# Patient Record
Sex: Male | Born: 1965 | Race: White | Hispanic: No | Marital: Single | Smoking: Never smoker
Health system: Southern US, Community
[De-identification: ages and names within clinical notes are randomized; demographics above are authoritative.]

## PROBLEM LIST (undated history)

## (undated) DIAGNOSIS — D849 Immunodeficiency, unspecified: Secondary | ICD-10-CM

## (undated) DIAGNOSIS — Z21 Asymptomatic human immunodeficiency virus [HIV] infection status: Secondary | ICD-10-CM

## (undated) DIAGNOSIS — J45909 Unspecified asthma, uncomplicated: Secondary | ICD-10-CM

## (undated) DIAGNOSIS — A529 Late syphilis, unspecified: Secondary | ICD-10-CM

## (undated) HISTORY — PX: HERNIA REPAIR: SHX51

## (undated) HISTORY — PX: DENTAL SURGERY: SHX609

---

## 2007-07-06 ENCOUNTER — Encounter: Payer: Self-pay | Admitting: Internal Medicine

## 2009-04-03 ENCOUNTER — Ambulatory Visit: Payer: Self-pay | Admitting: Internal Medicine

## 2009-04-03 DIAGNOSIS — B20 Human immunodeficiency virus [HIV] disease: Secondary | ICD-10-CM

## 2009-04-03 LAB — CONVERTED CEMR LAB
ALT: 21 units/L (ref 0–53)
Basophils Absolute: 0 10*3/uL (ref 0.0–0.1)
Basophils Relative: 1 % (ref 0–1)
CO2: 26 meq/L (ref 19–32)
Calcium: 9.2 mg/dL (ref 8.4–10.5)
Chlamydia, Swab/Urine, PCR: NEGATIVE
Chloride: 101 meq/L (ref 96–112)
Cholesterol: 198 mg/dL (ref 0–200)
Creatinine, Ser: 1.11 mg/dL (ref 0.40–1.50)
Glucose, Bld: 92 mg/dL (ref 70–99)
HCV Ab: NEGATIVE
HIV-1 RNA Quant, Log: <1.68 (ref <1.68)
HIV: REACTIVE
Hemoglobin, Urine: NEGATIVE
Hep B Core Total Ab: POSITIVE — AB
Hep B S Ab: POSITIVE — AB
Leukocytes, UA: NEGATIVE
MCHC: 35.2 g/dL (ref 30.0–36.0)
Neutro Abs: 3.3 10*3/uL (ref 1.7–7.7)
Neutrophils Relative %: 71 % (ref 43–77)
Platelets: 233 10*3/uL (ref 150–400)
RDW: 13.1 % (ref 11.5–15.5)
Sodium: 138 meq/L (ref 135–145)
Total Protein: 7.1 g/dL (ref 6.0–8.3)
Urine Glucose: NEGATIVE mg/dL
VLDL: 28 mg/dL (ref 0–40)
pH: 6.5 (ref 5.0–8.0)

## 2009-04-22 ENCOUNTER — Ambulatory Visit: Payer: Self-pay | Admitting: Internal Medicine

## 2009-04-22 ENCOUNTER — Encounter (INDEPENDENT_AMBULATORY_CARE_PROVIDER_SITE_OTHER): Payer: Self-pay | Admitting: *Deleted

## 2009-07-21 ENCOUNTER — Telehealth (INDEPENDENT_AMBULATORY_CARE_PROVIDER_SITE_OTHER): Payer: Self-pay | Admitting: *Deleted

## 2009-07-21 ENCOUNTER — Ambulatory Visit: Payer: Self-pay | Admitting: Internal Medicine

## 2009-07-21 LAB — CONVERTED CEMR LAB
ALT: 16 units/L (ref 0–53)
Albumin: 4.5 g/dL (ref 3.5–5.2)
CO2: 29 meq/L (ref 19–32)
Calcium: 9.2 mg/dL (ref 8.4–10.5)
Chloride: 104 meq/L (ref 96–112)
Creatinine, Ser: 0.85 mg/dL (ref 0.40–1.50)
HCT: 45.7 % (ref 39.0–52.0)
HIV 1 RNA Quant: <48 copies/mL (ref <48)
HIV-1 RNA Quant, Log: <1.68 (ref <1.68)
Lymphocytes Relative: 23 % (ref 12–46)
Lymphs Abs: 1 10*3/uL (ref 0.7–4.0)
Neutro Abs: 2.8 10*3/uL (ref 1.7–7.7)
Neutrophils Relative %: 66 % (ref 43–77)
Platelets: 208 10*3/uL (ref 150–400)
Total Protein: 6.9 g/dL (ref 6.0–8.3)
WBC: 4.3 10*3/uL (ref 4.0–10.5)

## 2009-07-25 ENCOUNTER — Encounter: Payer: Self-pay | Admitting: Internal Medicine

## 2009-08-06 ENCOUNTER — Ambulatory Visit: Payer: Self-pay | Admitting: Internal Medicine

## 2009-08-08 ENCOUNTER — Telehealth: Payer: Self-pay | Admitting: Internal Medicine

## 2009-09-26 ENCOUNTER — Telehealth (INDEPENDENT_AMBULATORY_CARE_PROVIDER_SITE_OTHER): Payer: Self-pay | Admitting: *Deleted

## 2009-11-05 ENCOUNTER — Emergency Department (HOSPITAL_COMMUNITY): Admission: EM | Admit: 2009-11-05 | Discharge: 2009-11-05 | Payer: Self-pay | Admitting: Emergency Medicine

## 2009-11-05 ENCOUNTER — Ambulatory Visit: Payer: Self-pay | Admitting: Internal Medicine

## 2009-11-05 LAB — CONVERTED CEMR LAB
ALT: 18 units/L (ref 0–53)
AST: 18 units/L (ref 0–37)
Alkaline Phosphatase: 86 units/L (ref 39–117)
Basophils Absolute: 0 10*3/uL (ref 0.0–0.1)
Calcium: 9.5 mg/dL (ref 8.4–10.5)
Chloride: 101 meq/L (ref 96–112)
Creatinine, Ser: 1.06 mg/dL (ref 0.40–1.50)
Eosinophils Absolute: 0.1 10*3/uL (ref 0.0–0.7)
Lymphocytes Relative: 12 % (ref 12–46)
Lymphs Abs: 0.7 10*3/uL (ref 0.7–4.0)
Neutrophils Relative %: 79 % — ABNORMAL HIGH (ref 43–77)
Platelets: 193 10*3/uL (ref 150–400)
Potassium: 4 meq/L (ref 3.5–5.3)
WBC: 5.9 10*3/uL (ref 4.0–10.5)

## 2009-11-19 ENCOUNTER — Ambulatory Visit: Payer: Self-pay | Admitting: Internal Medicine

## 2009-11-19 ENCOUNTER — Telehealth (INDEPENDENT_AMBULATORY_CARE_PROVIDER_SITE_OTHER): Payer: Self-pay | Admitting: *Deleted

## 2010-02-23 ENCOUNTER — Ambulatory Visit: Payer: Self-pay | Admitting: Internal Medicine

## 2010-02-23 LAB — CONVERTED CEMR LAB
BUN: 12 mg/dL (ref 6–23)
Basophils Relative: 1 % (ref 0–1)
CO2: 25 meq/L (ref 19–32)
Calcium: 9.2 mg/dL (ref 8.4–10.5)
Chloride: 101 meq/L (ref 96–112)
Creatinine, Ser: 1 mg/dL (ref 0.40–1.50)
Eosinophils Absolute: 0 10*3/uL (ref 0.0–0.7)
Eosinophils Relative: 0 % (ref 0–5)
Glucose, Bld: 93 mg/dL (ref 70–99)
HCT: 40.6 % (ref 39.0–52.0)
HIV 1 RNA Quant: <48 copies/mL (ref <48)
HIV-1 RNA Quant, Log: <1.68 (ref <1.68)
Lymphs Abs: 0.7 10*3/uL (ref 0.7–4.0)
MCHC: 33 g/dL (ref 30.0–36.0)
MCV: 88.5 fL (ref 78.0–100.0)
Monocytes Absolute: 0.4 10*3/uL (ref 0.1–1.0)
Monocytes Relative: 10 % (ref 3–12)
Neutrophils Relative %: 74 % (ref 43–77)
RBC: 4.59 M/uL (ref 4.22–5.81)
Total Bilirubin: 0.5 mg/dL (ref 0.3–1.2)

## 2010-03-09 ENCOUNTER — Telehealth: Payer: Self-pay | Admitting: Internal Medicine

## 2010-04-10 ENCOUNTER — Telehealth: Payer: Self-pay | Admitting: Internal Medicine

## 2010-04-24 ENCOUNTER — Telehealth: Payer: Self-pay | Admitting: Internal Medicine

## 2010-05-06 ENCOUNTER — Encounter (INDEPENDENT_AMBULATORY_CARE_PROVIDER_SITE_OTHER): Payer: Self-pay | Admitting: *Deleted

## 2010-05-14 ENCOUNTER — Telehealth: Payer: Self-pay | Admitting: Infectious Disease

## 2010-05-18 ENCOUNTER — Encounter: Payer: Self-pay | Admitting: Internal Medicine

## 2010-05-18 ENCOUNTER — Ambulatory Visit: Payer: Self-pay | Admitting: Infectious Disease

## 2010-05-18 DIAGNOSIS — A523 Neurosyphilis, unspecified: Secondary | ICD-10-CM

## 2010-05-18 DIAGNOSIS — I82409 Acute embolism and thrombosis of unspecified deep veins of unspecified lower extremity: Secondary | ICD-10-CM | POA: Insufficient documentation

## 2010-05-18 LAB — CONVERTED CEMR LAB
HIV 1 RNA Quant: <48 copies/mL (ref <48)
INR: 2.33 — ABNORMAL HIGH (ref <1.50)
Prothrombin Time: 25.7 s — ABNORMAL HIGH (ref 11.6–15.2)

## 2010-05-19 ENCOUNTER — Telehealth (INDEPENDENT_AMBULATORY_CARE_PROVIDER_SITE_OTHER): Payer: Self-pay | Admitting: *Deleted

## 2010-05-19 ENCOUNTER — Telehealth: Payer: Self-pay | Admitting: Infectious Disease

## 2010-09-01 ENCOUNTER — Encounter (INDEPENDENT_AMBULATORY_CARE_PROVIDER_SITE_OTHER): Payer: Self-pay | Admitting: *Deleted

## 2010-10-20 NOTE — Progress Notes (Signed)
Summary: Received income info--pt needs visit in [facility]  Phone Note From Other Clinic   Caller: [facility] ID clinic Summary of Call: Rodell Perna called to say that patient is being discharged from their hospital.  He said patient finally faxed in proof of income that I had told him ADAP needed in which was the 2010 tax returns.  Rosanne Ashing is going to fax them to [facility] so we can submit for patient.  I told Rosanne Ashing that patient will still need to come to [facility] to complete the ADAP application since it has been a few months and ADAP never got the correct info.  He said that was the same thing he was just told by Quay Burow in the absence of Nobie Putnam who is on out today.  Patient needs to have an appointment in order to obtain refills. Initial call taken by: Paulo Fruit  BS,CPht II,MPH,  April 24, 2010 2:09 PM     Appended Document: Received income info--pt needs visit in [facility] patient came in today to complete paperwork for ncadap and RW program.  His application will be resent with the appropriate information ADAP has requested.

## 2010-10-20 NOTE — Assessment & Plan Note (Signed)
Summary: F/U/VS   CC:  3 month follow up.  History of Present Illness: Pt was sick with a URI about 2 weeks ago. He feels better now. He has not missed any doses of his meds.  Preventive Screening-Counseling & Management  Alcohol-Tobacco     Alcohol drinks/day: 0     Smoking Status: never  Caffeine-Diet-Exercise     Caffeine use/day: tea, some soda     Type of exercise: active at work     Exercise (avg: min/session): >60     Times/week: 5  Safety-Violence-Falls     Seat Belt Use: yes   Updated Prior Medication List: ATRIPLA 600-200-300 MG TABS (EFAVIRENZ-EMTRICITAB-TENOFOVIR) Take 1 tablet by mouth at bedtime BACTRIM DS 800-160 MG TABS (SULFAMETHOXAZOLE-TRIMETHOPRIM) Take 1 tablet by mouth once a day  Current Allergies (reviewed today): ! PCN Review of Systems  The patient denies anorexia, fever, and weight loss.    Vital Signs:  Patient profile:   45 year old male Height:      70 inches (177.80 cm) Weight:      157.8 pounds (71.73 kg) BMI:     22.72 Temp:     97.7 degrees F (36.50 degrees C) oral Pulse rate:   69 / minute BP sitting:   132 / 71  (left arm)  Vitals Entered By: Baxter Hire) (November 19, 2009 10:08 AM) CC: 3 month follow up Pain Assessment Patient in pain? no      Nutritional Status BMI of 19 -24 = normal Nutritional Status Detail appetite is good per patient  Have you ever been in a relationship where you felt threatened, hurt or afraid?No   Does patient need assistance? Functional Status Self care Ambulation Normal   Physical Exam  General:  alert, well-developed, well-nourished, and well-hydrated.   Head:  normocephalic and atraumatic.   Mouth:  pharynx pink and moist.   Lungs:  normal breath sounds.      Impression & Recommendations:  Problem # 1:  HIV INFECTION (ICD-042) Pt.s most recent CD4ct was150  and VL <48 .  Pt instructed to continue the current antiretroviral regimen.  Pt encouraged to take medication  regularly and not miss doses.  Pt will f/u in 3 months for repeat blood work and will see me 2 weeks later.  Diagnostics Reviewed:  HIV: CDC-defined AIDS (04/22/2009)   HIV-Western blot: Positive (04/03/2009)   CD4: 150 (11/06/2009)   WBC: 5.9 (11/05/2009)   Hgb: 15.7 (11/05/2009)   HCT: 45.6 (11/05/2009)   Platelets: 193 (11/05/2009) HIV-1 RNA: <48 copies/mL (11/05/2009)   HBSAg: NEG (04/03/2009)  Orders: Est. Patient Level III (99213)Future Orders: T-CD4SP ([facility]) (CD4SP) ... 02/17/2010 T-HIV Viral Load [phone]) ... 02/17/2010 T-Comprehensive Metabolic Panel [phone]) ... 02/17/2010 T-CBC w/Diff ([id number]) ... 02/17/2010  Patient Instructions: 1)  Please schedule a follow-up appointment in 3 months, 2 weeks after labs.  Prescriptions: BACTRIM DS 800-160 MG TABS (SULFAMETHOXAZOLE-TRIMETHOPRIM) Take 1 tablet by mouth once a day  #30 x 2   Entered and Authorized by:   Yisroel Ramming MD   Signed by:   Yisroel Ramming MD on 11/19/2009   Method used:   Print then Give to Patient   RxID:   [id number]  Process Orders Check Orders Results:     [facility]: ABN not required for this insurance Tests Sent for requisitioning (November 19, 2009 10:23 AM):     02/17/2010: [facility] -- T-HIV Viral Load [phone] (signed)     02/17/2010:  [facility] -- T-Comprehensive Metabolic Panel [[id number]] (signed)     02/17/2010: [facility] -- T-CBC w/Diff [phone] (signed)

## 2010-10-20 NOTE — Progress Notes (Signed)
Summary: pt transfering care to [facility]  Phone Note Call from Patient   Caller: Patient Summary of Call: Patient called to say that he is transfering his care to [facility] and that he also received a letter in the mail in reference to his ADAP applciation that it is still pending. They are asking now for current updated income.  He said not to worry about resending at this time, for he will no longer be receiving care here in [place]. Initial call taken by: Paulo Fruit  BS,CPht II,MPH,  May 19, 2010 10:04 AM

## 2010-10-20 NOTE — Progress Notes (Signed)
Summary: Medication?  Phone Note From Other Clinic   Caller: [facility] Summary of Call: Trying to find out why patient does not have medication.  Jum Ausberger has already spoken to Nobie Putnam at [employer].  [employer] is still waiting on patient's 2010 tax return in which he has yet to bring.  I have spoken to Kaveh on 7 occassions and patient has faxed check stubbs, but ADAP wants addt'l information i e 2010 form.  Scot Dock was also told this by Nobie Putnam.  The reason why he called is that he wanted to know where patient was getting his medication because his VL is undectable, but patient has not had medication since March when the last sample was given to him.  Rosanne Ashing has said paitient is being treated at [facility] for Syphillis.  He is going to try and obtain the information patient never brought in order to see if he can start receiving medication.  If also asked who was following patient.  I said Kerby Borner.  Rosanne Ashing said if he needs anything he will call back. Initial call taken by: Paulo Fruit  BS,CPht II,MPH,  April 10, 2010 2:54 PM

## 2010-10-20 NOTE — Progress Notes (Signed)
Summary: lab results faxed  Phone Note Call from Patient   Caller: Patient Reason for Call: Lab or Test Results Summary of Call: Pt. called requesting INR results faxed to a different fax # at [facility] [phone].  Also wanted to thank Dr. Daiva Eves for all his help. Results faxed. Initial call taken by: Wendall Mola CMA [facility]),  May 19, 2010 10:30 AM

## 2010-10-20 NOTE — Miscellaneous (Signed)
Summary: clinical update/ryan white  Clinical Lists Changes  Observations: Added new observation of PCTFPL: 120.04  (05/06/2010 15:55) Added new observation of HOUSEINCOME: 09811  (05/06/2010 15:55) Added new observation of FINASSESSDT: 05/04/2010  (05/06/2010 15:55) Added new observation of RW VITAL STA: Active  (05/06/2010 15:55)

## 2010-10-20 NOTE — Progress Notes (Signed)
Summary: [employer] form complete/refills   Phone Note Call from Patient   Caller: patient appt w [facility] Summary of Call: Patient requesting a sample of Atripla. He never got it filled. He told me today he was unable to pay for medication in December witht he use of the copay card.  Recently, patient informed me that he got rid of his medical/drug coverage so he can get help. Asking for a prescription for Bactrim be sent to a pharmacy until he can get on a program that will supply this medication. Also asking for an Atripla sample. Initial call taken by: Paulo Fruit  BS,CPht II,MPH,  September 26, 2009 11:10 AM    Prescriptions: BACTRIM DS 800-160 MG TABS (SULFAMETHOXAZOLE-TRIMETHOPRIM) Take 1 tablet by mouth once a day  #30 x 2   Entered by:   Paulo Fruit  BS,CPht II,MPH   Authorized by:   Yisroel Ramming MD   Signed by:   Paulo Fruit  BS,CPht II,MPH on 09/26/2009   Method used:   Print then Give to Patient   RxID:   [id number] ATRIPLA 600-200-300 MG TABS (EFAVIRENZ-EMTRICITAB-TENOFOVIR) Take 1 tablet by mouth at bedtime  #30 x 0   Entered by:   Paulo Fruit  BS,CPht II,MPH   Authorized by:   Yisroel Ramming MD   Signed by:   Paulo Fruit  BS,CPht II,MPH on 09/26/2009   Method used:   Samples Given   RxID:   [id number]  Paulo Fruit  BS,CPht II,MPH  September 26, 2009 11:12 AM  Appended Document: [employer] form complete/refills  Sample Given, Lot #: [id number] [id number] Expiration Date:03 2012 Patient has been instructed regarding the correct time, dose and frequency of taking this med, including desired effects and most common side effects.

## 2010-10-20 NOTE — Progress Notes (Signed)
Summary: Sample given. last time  Phone Note Refill Request      Prescriptions: ATRIPLA 600-200-300 MG TABS (EFAVIRENZ-EMTRICITAB-TENOFOVIR) Take 1 tablet by mouth at bedtime  #30 x 0   Entered by:   Paulo Fruit  BS,CPht II,MPH   Authorized by:   Yisroel Ramming MD   Signed by:   Paulo Fruit  BS,CPht II,MPH on 11/19/2009   Method used:   Samples Given   RxID:   [id number]  **we have yet to here from [employer] whether patient has been approved.  they keep requesting additonal income information.  I have told patient once again to bring in 2010 W2s so I can resend information for 3rd time to [place].  Patient out of med and asked for sample.  Sample Given, Lot #: [id number] [id number] Expiration Date:02 2012 Patient has been instructed regarding the correct time, dose and frequency of taking this med, including desired effects and most common side effects. Paulo Fruit  BS,CPht II,MPH  November 19, 2009 10:58 AM

## 2010-10-20 NOTE — Progress Notes (Signed)
Summary: lab results  Phone Note Call from Patient   Caller: Patient Call For: Eddie Ramming MD Reason for Call: Talk to Nurse Details for Reason: pt. called for lab results Summary of Call: Pt. had appt. scheduled with Dr. Philipp Deputy for Wednesday 03/11/10 and work schedule changed today.  Requested lab results.  After verifying pts. identity with birthdate and social, gave results.  Pt. to call in three months to schedule lab and doctor appts. Initial call taken by: Wendall Mola CMA [facility]),  March 09, 2010 3:26 PM

## 2010-10-20 NOTE — Progress Notes (Signed)
Summary: Pt seen at 2 HIV clinics.  Phone Note From Other Clinic   Caller: Nurse Details for Reason: jim Ausperger Summary of Call: [facility] medical called and said that patient has an appt on Monday 05/18/10 in their ID clinic to follow up on HIV care and neruosurgery from his Syphilis. They just wanted [place] to know.  Patient is still pending approval for ADAP here in [facility]. Initial call taken by: Paulo Fruit  BS,CPht II,MPH,  May 14, 2010 2:39 PM

## 2010-10-20 NOTE — Assessment & Plan Note (Signed)
Summary: F/U/OV/DR Bea Laura PT/PER TAMMY/VS   CC:  f/u.  History of Present Illness: Pt stil having significant trouble obtaining meds; ADAP currently pending.   He states he submitted all needed paperwork for ADAP/Ryan White earlier this month.  He states has three pills of Atripla left; apparently he received his last rx from a friend's doctor in [place].  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  He reports f/u LP planned for January 2011. 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  Coumadinis  being managed by Dr. Hollace Kinnier at [facility]; last INR checked 8/15, at that time dose changed from 10mg  once daily with 15mg  on Monday to 15mg  MWF.  He is doing well on pred-forte and scopalamine per optho and will f/u with them on 06/11/10.   He wishes to transfer care to [facility].  He had an appt with them this morning before his appt at [facility].  His next appt is scheduled for 06/16/10 with Emelda Brothers, NP.  He has no other concerns/complaints. Dr. Azucena Freed [phone] Emelda Brothers, NP (ID) [phone] (ph), [phone] (office), [phone] (fax)   Updated Prior Medication List: ATRIPLA 600-200-300 MG TABS (EFAVIRENZ-EMTRICITAB-TENOFOVIR) Take 1 tablet by mouth at bedtime WARFARIN SODIUM 10 MG TABS (WARFARIN SODIUM) TUES. Thurs, SAT and Sun 10 mg Mon. Wed. Fri 15 mg  Current Allergies (reviewed today): ! PCN Past History:  Past medical, surgical, family and social histories (including risk factors) reviewed for relevance to current acute and chronic problems.  Family History: Reviewed history from 04/22/2009 and no changes required. Family History Diabetes 1st degree relative  Social History: Reviewed history and no changes required.  Vital Signs:  Patient profile:   45 year old male Height:      70 inches (177.80  cm) Weight:      147.75 pounds (67.16 kg) BMI:     21.28 Temp:     97.6 degrees F (36.44 degrees C) oral Pulse rate:   61 / minute BP sitting:   127 / 79  (left arm)  Vitals Entered By: Starleen Arms CMA (May 18, 2010 11:23 AM) CC: f/u Is Patient Diabetic? No Pain Assessment Patient in pain? no      Nutritional Status BMI of 19 -24 = normal  Does patient need assistance? Functional Status Self care Ambulation Normal   Physical Exam  General:  alert, well-developed, well-nourished, and well-hydrated.   Head:  normocephalic and atraumatic.   Eyes:  vision grossly intact.  + Argyll-Robertson pupil present in left eye; accomodates, non-reactive.  Right pupil wnl and reactive.  Anicteric.  No conjunctival pallor or injection. Mouth:  pharynx pink and moist.   Neck:  no cervical lymphadenopathy.   Lungs:  normal breath sounds.   Heart:  normal rate and regular rhythm.   Extremities:  No edema Neurologic:  alert & oriented X3, cranial nerves II-XII intact, strength normal in all extremities, sensation intact to light touch, and gait normal.  alert & oriented X3, cranial nerves II-XII intact, strength normal in all extremities, sensation intact to light touch, and gait normal.   Skin:  turgor normal and color normal.  turgor normal and color normal.   Psych:  Oriented X3, memory intact for recent and remote, normally interactive, good eye contact, not anxious appearing, and not depressed appearing.  Oriented X3, memory intact for recent and remote,  normally interactive, good eye contact, not anxious appearing, and not depressed appearing.       Impression & Recommendations:  Problem # 1:  HIV INFECTION (ICD-042)  Pt will transition care to [facility] ID.  He cannot recall when his last CD4 and VL were checked.  WIll check CD4 and VL  today and fax results to Emelda Brothers with ID.  Pt reports his last CD4 was > 200, however as we have no evidence of a recent CD4 > 200, it is  prudent to continue PCP ppx.  Will d/c bactrim to avoid advere interactions with coumadin.  Will check G6PD assay and begin PCP ppx with dapsone pending G6PD results.    Reviewed notes re: ADAP and Juanell Fairly.  It appears as though the application was sent with all necessary paperwork.  No samples are available to provide pt with supply of Atripla today.  Will have him speak with Paulo Fruit re: ADAP, Juanell Fairly, Atripla rx, etc.  The following medications were removed from the medication list:    Bactrim Ds 800-160 Mg Tabs (Sulfamethoxazole-trimethoprim) .Marland Kitchen... Take 1 tablet by mouth once a day  Orders: T-HIV Viral Load [id number]) T-CD4 [phone]) T-Assay of G6PD Enzyme ([id number]) Est. 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  Orders: Est. 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 Janee Morn and pts new ID clinic. Orders: T-Protime, Auto ([id number]) Est. Patient Level IV (43329)  Medications Added to Medication List This Visit: 1)  Warfarin Sodium 10 Mg Tabs (Warfarin sodium) .... Tues. thurs, sat and sun 10 mg mon. wed. fri 15 mg Prescriptions: ATRIPLA 600-200-300 MG TABS (EFAVIRENZ-EMTRICITAB-TENOFOVIR) Take 1 tablet by mouth at bedtime  #30 x 11   Entered and Authorized by:   Acey Lav MD   Signed by:   Nelda Bucks DO on 05/18/2010   Method used:   Print then Give to Patient   RxID:   [id number]   Appended Document: F/U/OV/DR Bea Laura PT/PER TAMMY/VS   Immunizations  Administered:  Influenza Vaccine # 1:    Vaccine Type: Fluvax 3+    Site: left deltoid    Mfr: novartis    Dose: 0.5 ml    Route: IM    Given by: Starleen Arms CMA    Exp. Date: 12/19/2009    Lot #: [id number]    VIS given: 04/13/07 version given May 18, 2010.  Flu Vaccine Consent Questions:    Do you have a history of severe allergic reactions to this vaccine? no    Any prior history of allergic reactions to egg and/or gelatin? no    Do you have a sensitivity to the preservative Thimersol? no    Do you have a past history of Guillan-Barre Syndrome? no    Do you currently have an acute febrile illness? no    Have you ever had a severe reaction to latex? no    Vaccine information given and explained to patient? yes   Appended Document: F/U/OV/DR Bea Laura PT/PER TAMMY/VS Pt was  seen and examined with the resident. I agree with the assessment and plan as outlined in her note. Pt is transitioning care to [facility] where he was dx with ocular syphilis and is sp 2 wks of IV PCN. I have written atripla rx for him, though he had trouble with his paperwork for adap. I will check INR and fax to his PCP

## 2010-10-22 NOTE — Miscellaneous (Signed)
  Clinical Lists Changes  Observations: Added new observation of YEARAIDSPOS: 2010  (09/01/2010 16:38)

## 2010-12-04 LAB — T-HELPER CELL (CD4) - (RCID CLINIC ONLY)
CD4 % Helper T Cell: 24 % — ABNORMAL LOW (ref 33–55)
CD4 T Cell Abs: 270 uL — ABNORMAL LOW (ref 400–2700)

## 2010-12-27 LAB — T-HELPER CELL (CD4) - (RCID CLINIC ONLY)
CD4 % Helper T Cell: 22 % — ABNORMAL LOW (ref 33–55)
CD4 T Cell Abs: 230 uL — ABNORMAL LOW (ref 400–2700)

## 2011-04-26 ENCOUNTER — Telehealth: Payer: Self-pay | Admitting: *Deleted

## 2011-04-26 NOTE — Telephone Encounter (Signed)
Called patient and left message for him to call and schedule lab and f/u office visit. Wendall Mola CMA

## 2015-12-20 ENCOUNTER — Emergency Department
Admission: EM | Admit: 2015-12-20 | Discharge: 2015-12-20 | Disposition: A | Discharge status: Home / Self Care | Payer: Self-pay | Attending: Emergency Medicine | Admitting: Emergency Medicine

## 2015-12-20 ENCOUNTER — Encounter: Payer: Self-pay | Admitting: Emergency Medicine

## 2015-12-20 DIAGNOSIS — B349 Viral infection, unspecified: Secondary | ICD-10-CM | POA: Insufficient documentation

## 2015-12-20 HISTORY — DX: Immunodeficiency, unspecified: D84.9

## 2015-12-20 MED ORDER — ONDANSETRON HCL 4 MG PO TABS: 4.0000 mg | ORAL_TABLET | Freq: Three times a day (TID) | ORAL | Status: DC | PRN

## 2015-12-20 MED ORDER — GUAIFENESIN-CODEINE 100-10 MG/5ML PO SYRP: 5.0000 mL | ORAL_SOLUTION | Freq: Three times a day (TID) | ORAL | Status: DC | PRN

## 2015-12-20 NOTE — ED Provider Notes (Signed)
CSN: [id number]     Arrival date & time 12/20/15  1921 History   First MD Initiated Contact with Patient 12/20/15 2027     Chief Complaint  Patient presents with  . Cough    fever, cough, bodyaches     HPI   50 year old male who presents to the emergency department for evaluation of cough, cold, body aches, sore throat, and fever. Symptoms started 3 days ago. He has not been taking any type of over-the-counter medications for his symptoms. He states that tonight he is unable to go to work and will need a work excuse.  Past Medical History  Diagnosis Date  . Immune deficiency disorder [facility](HCC)    Past Surgical History  Procedure Laterality Date  . Hernia repair     History reviewed. No pertinent family history. Social History  Substance Use Topics  . Smoking status: Never Smoker   . Smokeless tobacco: None  . Alcohol Use: No    Review of Systems  Constitutional: Positive for fever, chills, activity change, appetite change and fatigue.  HENT: Positive for rhinorrhea, sinus pressure, sneezing and sore throat. Negative for ear pain, mouth sores and trouble swallowing.   Respiratory: Positive for cough. Negative for shortness of breath and wheezing.   Cardiovascular: Negative for chest pain.  Gastrointestinal: Negative for vomiting and diarrhea.  Genitourinary: Negative for decreased urine volume.  Musculoskeletal: Positive for myalgias.  Skin: Negative for rash.  Neurological: Positive for headaches. Negative for weakness.      Allergies  Penicillins  Home Medications   Prior to Admission medications   Medication Sig Start Date End Date Taking? Authorizing Provider  guaiFENesin-codeine (ROBITUSSIN AC) 100-10 MG/5ML syrup Take 5 mLs by mouth 3 (three) times daily as needed for cough. 12/20/15   Chinita Pesterari B Gustabo Gordillo, FNP  ondansetron (ZOFRAN) 4 MG tablet Take 1 tablet (4 mg total) by mouth every 8 (eight) hours as needed for nausea or vomiting. 12/20/15   Anmol Fleck B Lakysha Kossman, FNP   BP  121/73 mmHg  Pulse 96  Temp(Src) 99.5 F (37.5 C)  Resp 18  Ht 5\' 10"  (1.778 m)  Wt 63.504 kg  BMI 20.09 kg/m2  SpO2 97% Physical Exam  Constitutional: He is oriented to person, place, and time. He appears well-developed.  HENT:  Head: Normocephalic.  Right Ear: External ear normal.  Left Ear: External ear normal.  Mouth/Throat: Oropharynx is clear and moist. No oropharyngeal exudate.  Eyes: Conjunctivae are normal.  Neck: Normal range of motion. Neck supple.  Cardiovascular: Normal rate.   Pulmonary/Chest: Effort normal and breath sounds normal.  Abdominal: Soft.  Musculoskeletal: Normal range of motion.  Neurological: He is alert and oriented to person, place, and time.  Skin: Skin is warm and dry. He is not diaphoretic.  Psychiatric: Judgment and thought content normal.    ED Course  Procedures (including critical care time) Labs Review Labs Reviewed - No data to display  Imaging Review No results found. I have personally reviewed and evaluated these images and lab results as part of my medical decision-making.   EKG Interpretation None      MDM   Final diagnoses:  Viral syndrome    Patient was discharged home with a prescription for Robitussin-AC and a work note for the next 2 days. He is encouraged follow-up with his primary care provider for symptoms that are not improving over the next 2-3 days. He was advised to return the emergency department for symptoms that change or worsen if unable  to schedule an appointment.    Chinita Pester, FNP 12/21/15 0019  Jeanmarie Plant, MD 12/21/15 1537

## 2015-12-20 NOTE — ED Notes (Signed)
Cough, cold, bodyaches - not taken any meds

## 2015-12-20 NOTE — Discharge Instructions (Signed)
Viral Infections °A viral infection can be caused by different types of viruses. Most viral infections are not serious and resolve on their own. However, some infections may cause severe symptoms and may lead to further complications. °SYMPTOMS °Viruses can frequently cause: °· Minor sore throat. °· Aches and pains. °· Headaches. °· Runny nose. °· Different types of rashes. °· Watery eyes. °· Tiredness. °· Cough. °· Loss of appetite. °· Gastrointestinal infections, resulting in nausea, vomiting, and diarrhea. °These symptoms do not respond to antibiotics because the infection is not caused by bacteria. However, you might catch a bacterial infection following the viral infection. This is sometimes called a "superinfection." Symptoms of such a bacterial infection may include: °· Worsening sore throat with pus and difficulty swallowing. °· Swollen neck glands. °· Chills and a high or persistent fever. °· Severe headache. °· Tenderness over the sinuses. °· Persistent overall ill feeling (malaise), muscle aches, and tiredness (fatigue). °· Persistent cough. °· Yellow, green, or brown mucus production with coughing. °HOME CARE INSTRUCTIONS  °· Only take over-the-counter or prescription medicines for pain, discomfort, diarrhea, or fever as directed by your caregiver. °· Drink enough water and fluids to keep your urine clear or pale yellow. Sports drinks can provide valuable electrolytes, sugars, and hydration. °· Get plenty of rest and maintain proper nutrition. Soups and broths with crackers or rice are fine. °SEEK IMMEDIATE MEDICAL CARE IF:  °· You have severe headaches, shortness of breath, chest pain, neck pain, or an unusual rash. °· You have uncontrolled vomiting, diarrhea, or you are unable to keep down fluids. °· You or your child has an oral temperature above 102° F (38.9° C), not controlled by medicine. °· Your baby is older than 3 months with a rectal temperature of 102° F (38.9° C) or higher. °· Your baby is 3  months old or younger with a rectal temperature of 100.4° F (38° C) or higher. °MAKE SURE YOU:  °· Understand these instructions. °· Will watch your condition. °· Will get help right away if you are not doing well or get worse. °  °This information is not intended to replace advice given to you by your health care provider. Make sure you discuss any questions you have with your health care provider. °  °Document Released: 06/16/2005 Document Revised: 11/29/2011 Document Reviewed: 02/12/2015 °Elsevier Interactive Patient Education ©2016 Elsevier Inc. ° °

## 2016-03-31 ENCOUNTER — Ambulatory Visit
Admission: EM | Admit: 2016-03-31 | Discharge: 2016-03-31 | Discharge status: Against Medical Advice | Payer: Worker's Compensation | Attending: Family Medicine | Admitting: Family Medicine

## 2016-03-31 ENCOUNTER — Emergency Department: Payer: Self-pay

## 2016-03-31 ENCOUNTER — Emergency Department
Admission: EM | Admit: 2016-03-31 | Discharge: 2016-03-31 | Disposition: A | Discharge status: Home / Self Care | Payer: Self-pay | Attending: Emergency Medicine | Admitting: Emergency Medicine

## 2016-03-31 DIAGNOSIS — W19XXXA Unspecified fall, initial encounter: Secondary | ICD-10-CM | POA: Insufficient documentation

## 2016-03-31 DIAGNOSIS — Y929 Unspecified place or not applicable: Secondary | ICD-10-CM | POA: Insufficient documentation

## 2016-03-31 DIAGNOSIS — Z21 Asymptomatic human immunodeficiency virus [HIV] infection status: Secondary | ICD-10-CM | POA: Diagnosis present

## 2016-03-31 DIAGNOSIS — S0990XA Unspecified injury of head, initial encounter: Secondary | ICD-10-CM

## 2016-03-31 DIAGNOSIS — R55 Syncope and collapse: Secondary | ICD-10-CM | POA: Insufficient documentation

## 2016-03-31 DIAGNOSIS — Y990 Civilian activity done for income or pay: Secondary | ICD-10-CM | POA: Insufficient documentation

## 2016-03-31 DIAGNOSIS — W1839XA Other fall on same level, initial encounter: Secondary | ICD-10-CM | POA: Insufficient documentation

## 2016-03-31 DIAGNOSIS — S0191XA Laceration without foreign body of unspecified part of head, initial encounter: Secondary | ICD-10-CM

## 2016-03-31 DIAGNOSIS — Y939 Activity, unspecified: Secondary | ICD-10-CM | POA: Insufficient documentation

## 2016-03-31 HISTORY — DX: Asymptomatic human immunodeficiency virus (hiv) infection status: Z21

## 2016-03-31 LAB — BASIC METABOLIC PANEL
Anion gap: 9 (ref 5–15)
BUN: 20 mg/dL (ref 6–20)
CALCIUM: 9.3 mg/dL (ref 8.9–10.3)
CHLORIDE: 98 mmol/L — AB (ref 101–111)
CO2: 27 mmol/L (ref 22–32)
CREATININE: 1.36 mg/dL — AB (ref 0.61–1.24)
GFR calc non Af Amer: 59 mL/min — ABNORMAL LOW (ref >60)
Glucose, Bld: 105 mg/dL — ABNORMAL HIGH (ref 65–99)
Potassium: 3.5 mmol/L (ref 3.5–5.1)
SODIUM: 134 mmol/L — AB (ref 135–145)

## 2016-03-31 LAB — CBC
HCT: 42.5 % (ref 40.0–52.0)
Hemoglobin: 15.1 g/dL (ref 13.0–18.0)
MCH: 31.7 pg (ref 26.0–34.0)
MCHC: 35.5 g/dL (ref 32.0–36.0)
MCV: 89.3 fL (ref 80.0–100.0)
PLATELETS: 270 10*3/uL (ref 150–440)
RBC: 4.76 MIL/uL (ref 4.40–5.90)
RDW: 13 % (ref 11.5–14.5)
WBC: 10.6 10*3/uL (ref 3.8–10.6)

## 2016-03-31 LAB — TROPONIN I

## 2016-03-31 LAB — GLUCOSE, CAPILLARY: Glucose-Capillary: 112 mg/dL — ABNORMAL HIGH (ref 65–99)

## 2016-03-31 MED ORDER — LIDOCAINE-EPINEPHRINE (PF) 1 %-1:200000 IJ SOLN
INTRAMUSCULAR | Status: AC
  Filled 2016-03-31: qty 30

## 2016-03-31 MED ORDER — LIDOCAINE-EPINEPHRINE (PF) 2 %-1:200000 IJ SOLN: 10.0000 mL | Freq: Once | INTRAMUSCULAR | Status: DC

## 2016-03-31 NOTE — ED Notes (Signed)
Unable to do Workman's Comp due to patient not having correct COC. Attempted to call supervisor and HR various times and no response. Ineligibility form complete and explained to patient why this couldn't be complete and to call his job early in the morning to let them know.

## 2016-03-31 NOTE — ED Provider Notes (Signed)
CSN: [id number]     Arrival date & time 03/31/16  1357 History   First MD Initiated Contact with Patient 03/31/16 1414     No chief complaint on file.  (Consider location/radiation/quality/duration/timing/severity/associated sxs/prior Treatment) HPI: Patient presents today with history of head injury that happened at work earlier today. 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 He had a syncopal episode at work. He has a laceration to the occiput of his head. Patient does complain of some dizziness and frontal headache. Patient only remembers being slightly dizzy before he had his syncopal episode. He does state that the warehouse is warm. He denies any syncopal episode similar to this before. Patient is HIV positive. He denies any alcohol use or illicit drug use. A taxi service brought him to the urgent care.  Past Medical History  Diagnosis Date  . Immune deficiency disorder (HCC)   . HIV antibody positive [facility](HCC)    Past Surgical History  Procedure Laterality Date  . Hernia repair     History reviewed. No pertinent family history. Social History  Substance Use Topics  . Smoking status: Never Smoker   . Smokeless tobacco: None  . Alcohol Use: No    Review of Systems: Negative except mentioned above.  Allergies  Penicillins  Home Medications   Prior to Admission medications   Medication Sig Start Date End Date Taking? Authorizing Provider  efavirenz-emtricitabine-tenofovir (ATRIPLA) 600-200-300 MG tablet Take 1 tablet by mouth at bedtime.   Yes Historical Provider, MD  guaiFENesin-codeine (ROBITUSSIN AC) 100-10 MG/5ML syrup Take 5 mLs by mouth 3 (three) times daily as needed for cough. 12/20/15   Chinita Pesterari B Triplett, FNP  ondansetron (ZOFRAN) 4 MG tablet Take 1 tablet (4 mg total) by mouth every 8 (eight) hours as needed for nausea or vomiting. 12/20/15   Chinita Pesterari B Triplett, FNP   Meds Ordered and Administered this Visit  Medications - No data to display  BP 144/92 mmHg  Pulse 92   Temp(Src) 97.2 F (36.2 C) (Tympanic)  Resp 17  SpO2 98% No data found.   Physical Exam:  HEENT: laceration to occiput of head, matted at this point with minimal bleeding NEURO: alert, oriented, CN II-XII grossly intact  ED Course  Procedures (including critical care time)  Labs Review Labs Reviewed  GLUCOSE, CAPILLARY - Abnormal; Notable for the following:    Glucose-Capillary 112 (*)    All other components within normal limits  CBG MONITORING, ED    Imaging Review No results found.  EKG was done which did show sinus rhythm with PVCs, no ST elevations or depressions. Agree with reading of EKG print out.   MDM  A/P: Syncope, head laceration, head trauma, HIV positive- recommend patient go to the ER for further evaluation and treatment, patient refused to go at this time even by EMS, he states that he would like to go home and get something to eat before going to the ER. I advised him that this was not a wise decision since we do not know why he had a syncopal episode and he could have one again. He also should have the laceration repaired. I advised him not to drive. He states that he will not drive to the ER and will have transportation there. Patient still refuses to go at this time. Patient will sign AMA and his work will be informed of this.    Jolene ProvostKirtida Hiliana Eilts, MD 03/31/16 [phone]

## 2016-03-31 NOTE — ED Provider Notes (Signed)
Time Seen: Approximately *2130  I have reviewed the triage notes  Chief Complaint: Head Injury and Loss of Consciousness   History of Present Illness: Eddie Hamilton is a 50 y.o. male who presents after a syncopal episode at work. Patient states he had very little sleep last night and found out that his father was in a severe motor vehicle accident in [place]. Patient states she got a phone call from his brother at 3 AM and obviously could not get back to sleep. He states he went to work anyway and was doing his normal work activity felt somewhat lightheaded and hungry. He states he was helping a colleague do a procedure and had a syncopal episode. He apparently fell backwards hitting the back of his head and has a frontal headache. She felt lightheaded prior to the syncopal episode and denies any chest pain or shortness of breath. He denies any fever, visual disturbances, nausea and vomiting etc. Patient went to an urgent care center and was referred to the emergency department. He states he went home first 2 weeks and states overall he just "" wants to go home and get some sleep "".   Past Medical History  Diagnosis Date  . Immune deficiency disorder (HCC)   . HIV antibody positive [facility])     Patient Active Problem List   Diagnosis Date Noted  . NEUROSYPHILIS 05/18/2010  . DVT 05/18/2010  . HIV INFECTION 04/03/2009    Past Surgical History  Procedure Laterality Date  . Hernia repair      Past Surgical History  Procedure Laterality Date  . Hernia repair      Current Outpatient Rx  Name  Route  Sig  Dispense  Refill  . efavirenz-emtricitabine-tenofovir (ATRIPLA) 600-200-300 MG tablet   Oral   Take 1 tablet by mouth at bedtime.         Marland Kitchen guaiFENesin-codeine (ROBITUSSIN AC) 100-10 MG/5ML syrup   Oral   Take 5 mLs by mouth 3 (three) times daily as needed for cough.   120 mL   0   . ondansetron (ZOFRAN) 4 MG tablet   Oral   Take 1 tablet (4 mg total) by mouth  every 8 (eight) hours as needed for nausea or vomiting.   20 tablet   0     Allergies:  Penicillins  Family History: No family history on file.  Social History: Social History  Substance Use Topics  . Smoking status: Never Smoker   . Smokeless tobacco: None  . Alcohol Use: No     Review of Systems:   10 point review of systems was performed and was otherwise negative:  Constitutional: No fever Eyes: No visual disturbances ENT: No sore throat, ear pain Cardiac: No chest pain Respiratory: No shortness of breath, wheezing, or stridor Abdomen: No abdominal pain, no vomiting, No diarrhea Endocrine: No weight loss, No night sweats Extremities: No peripheral edema, cyanosis Skin: No rashes, easy bruising Neurologic: No focal weakness, trouble with speech or swollowing Urologic: No dysuria, Hematuria, or urinary frequency   Physical Exam:  ED Triage Vitals  Enc Vitals Group     BP 03/31/16 1959 115/81 mmHg     Pulse Rate 03/31/16 1959 86     Resp 03/31/16 1959 16     Temp 03/31/16 1959 98.7 F (37.1 C)     Temp Source 03/31/16 1959 Oral     SpO2 03/31/16 1959 100 %     Weight 03/31/16 1959 155 lb (70.308 kg)  Height 03/31/16 1959 5\' 10"  (1.778 m)     Head Cir --      Peak Flow --      Pain Score 03/31/16 1959 2     Pain Loc --      Pain Edu? --      Excl. in GC? --     General: Awake , Alert , and Oriented times 3; GCS 15 Head: Normal cephalic ,5 cm occipital scalp laceration without any crepitus or step-off noted. Linear laceration through the skin and somewhat into the subcutaneous tissue Eyes: Pupils equal , round, reactive to light Nose/Throat: No nasal drainage, patent upper airway without erythema or exudate.  Neck: Supple, Full range of motion, No anterior adenopathy or palpable thyroid masses Lungs: Clear to ascultation without wheezes , rhonchi, or rales Heart: Regular rate, regular rhythm without murmurs , gallops , or rubs Abdomen: Soft, non  tender without rebound, guarding , or rigidity; bowel sounds positive and symmetric in all 4 quadrants. No organomegaly .        Extremities: 2 plus symmetric pulses. No edema, clubbing or cyanosis Neurologic: normal ambulation, Motor symmetric without deficits, sensory intact Skin: warm, dry, no rashes   Labs:   All laboratory work was reviewed including any pertinent negatives or positives listed below:  Labs Reviewed  BASIC METABOLIC PANEL - Abnormal; Notable for the following:    Sodium 134 (*)    Chloride 98 (*)    Glucose, Bld 105 (*)    Creatinine, Ser 1.36 (*)    GFR calc non Af Amer 59 (*)    All other components within normal limits  CBC  TROPONIN I  URINALYSIS COMPLETEWITH MICROSCOPIC ([facility] ONLY)  Laboratory work was reviewed and showed no clinically significant abnormalities.   EKG:  ED ECG REPORT I, Jennye MoccasinBrian S Marcele Kosta, the attending physician, personally viewed and interpreted this ECG.  Date: 03/31/2016 EKG Time: 2006 Rate: 102 Rhythm: normal sinus rhythm occasional PVC QRS Axis: normal Intervals: normal ST/T Wave abnormalities: Nonspecific ST-T wave abnormality Conduction Disturbances: none Narrative Interpretation: unremarkable   Radiology:     CT Head Wo Contrast (Final result) Result time: 03/31/16 21:07:54   Final result by Rad Results In Interface (03/31/16 21:07:54)   Narrative:   CLINICAL DATA: Syncopal episode with unwitnessed fall and trauma to the head.  EXAM: CT HEAD WITHOUT CONTRAST  CT CERVICAL SPINE WITHOUT CONTRAST  TECHNIQUE: Multidetector CT imaging of the head and cervical spine was performed following the standard protocol without intravenous contrast. Multiplanar CT image reconstructions of the cervical spine were also generated.  COMPARISON: None.  FINDINGS: CT HEAD FINDINGS  The brain has a normal appearance without evidence of malformation, atrophy, old or acute infarction, mass lesion,  hemorrhage, hydrocephalus or extra-axial collection. The calvarium is unremarkable. The paranasal sinuses, middle ears and mastoids are clear.  CT CERVICAL SPINE FINDINGS  Straightening of the normal cervical lordosis. No evidence of fracture or subluxation. No soft tissue swelling. Ordinary mild spondylosis at C3-4, C4-5, C5-6 and C6-7 without evidence of compressive central canal stenosis.  IMPRESSION: Head CT: Normal.  Cervical spine CT: Ordinary spondylosis. No acute or traumatic finding.   Electronically Signed By: Paulina FusiMark Shogry M.D. On: 03/31/2016 21:07          CT Cervical Spine Wo Contrast (Final result) Result time: 03/31/16 21:07:54   Final result by Rad Results In Interface (03/31/16 21:07:54)   Narrative:   CLINICAL DATA: Syncopal episode with unwitnessed fall and trauma to the  head.  EXAM: CT HEAD WITHOUT CONTRAST  CT CERVICAL SPINE WITHOUT CONTRAST  TECHNIQUE: Multidetector CT imaging of the head and cervical spine was performed following the standard protocol without intravenous contrast. Multiplanar CT image reconstructions of the cervical spine were also generated.  COMPARISON: None.  FINDINGS: CT HEAD FINDINGS  The brain has a normal appearance without evidence of malformation, atrophy, old or acute infarction, mass lesion, hemorrhage, hydrocephalus or extra-axial collection. The calvarium is unremarkable. The paranasal sinuses, middle ears and mastoids are clear.  CT CERVICAL SPINE FINDINGS  Straightening of the normal cervical lordosis. No evidence of fracture or subluxation. No soft tissue swelling. Ordinary mild spondylosis at C3-4, C4-5, C5-6 and C6-7 without evidence of compressive central canal stenosis.  IMPRESSION: Head CT: Normal.  Cervical spine CT: Ordinary spondylosis. No acute or traumatic finding.   Electronically Signed By: Paulina Fusi M.D. On: 03/31/2016 21:07     * I personally reviewed the  radiologic studies   Procedures:  Patient had repair of his 5 cm scalp laceration LACERATION REPAIR Performed by: Jennye Moccasin Authorized by: Jennye Moccasin Consent: Verbal consent obtained. Risks and benefits: risks, benefits and alternatives were discussed Consent given by: patient Patient identity confirmed: provided demographic data Prepped and Draped in normal sterile fashion Wound explored  Laceration Location:  Scalp occipital  Laceration Length: 5 cm  No Foreign Bodies seen or palpated  Anesthesia: local infiltration  Local anesthetic: lidocaine 1% with* epinephrine  Anesthetic total: 3 ml  Irrigation method: syringe Amount of cleaning: standard  Skin closure: *Interrupted Number of sutures: 5 staples*  Patient tolerance: Patient tolerated the procedure well with no immediate complications.      ED Course:  Patient's stay here was uneventful and is otherwise hemodynamically stable. His EKG does not show any evidence of a significantly prolonged QT interval, ischemic changes, or Brugada's disease. The patient otherwise was at work and admits to being feeling dehydrated and had lack of sleep secondary to his dad's trauma etc. Patient's head injury does not show any signs of a subdural epidural hematoma. He has occasional PVCs on his EKG however does not seem to have any signs of cardiogenic syncope at this time.   Assessment: Syncope Acute closed head injury 5 cm occipital scalp laceration with single layer non-complicated closure  Final Clinical Impression:  Final diagnoses:  Head trauma, initial encounter  Syncope, unspecified syncope type     Plan: * Outpatient Patient was advised to return immediately if condition worsens. Patient was advised to follow up with their primary care physician or other specialized physicians involved in their outpatient care. The patient and/or family member/power of attorney had laboratory results reviewed at the  bedside. All questions and concerns were addressed and appropriate discharge instructions were distributed by the nursing staff.             Jennye Moccasin, MD 04/01/16 0001

## 2016-03-31 NOTE — Discharge Instructions (Signed)
Syncope °Syncope is a medical term for fainting or passing out. This means you lose consciousness and drop to the ground. People are generally unconscious for less than 5 minutes. You may have some muscle twitches for up to 15 seconds before waking up and returning to normal. Syncope occurs more often in older adults, but it can happen to anyone. While most causes of syncope are not dangerous, syncope can be a sign of a serious medical problem. It is important to seek medical care.  °CAUSES  °Syncope is caused by a sudden drop in blood flow to the brain. The specific cause is often not determined. Factors that can bring on syncope include: °· Taking medicines that lower blood pressure. °· Sudden changes in posture, such as standing up quickly. °· Taking more medicine than prescribed. °· Standing in one place for too long. °· Seizure disorders. °· Dehydration and excessive exposure to heat. °· Low blood sugar (hypoglycemia). °· Straining to have a bowel movement. °· Heart disease, irregular heartbeat, or other circulatory problems. °· Fear, emotional distress, seeing blood, or severe pain. °SYMPTOMS  °Right before fainting, you may: °· Feel dizzy or light-headed. °· Feel nauseous. °· See all white or all black in your field of vision. °· Have cold, clammy skin. °DIAGNOSIS  °Your health care provider will ask about your symptoms, perform a physical exam, and perform an electrocardiogram (ECG) to record the electrical activity of your heart. Your health care provider may also perform other heart or blood tests to determine the cause of your syncope which may include: °· Transthoracic echocardiogram (TTE). During echocardiography, sound waves are used to evaluate how blood flows through your heart. °· Transesophageal echocardiogram (TEE). °· Cardiac monitoring. This allows your health care provider to monitor your heart rate and rhythm in real time. °· Holter monitor. This is a portable device that records your  heartbeat and can help diagnose heart arrhythmias. It allows your health care provider to track your heart activity for several days, if needed. °· Stress tests by exercise or by giving medicine that makes the heart beat faster. °TREATMENT  °In most cases, no treatment is needed. Depending on the cause of your syncope, your health care provider may recommend changing or stopping some of your medicines. °HOME CARE INSTRUCTIONS °· Have someone stay with you until you feel stable. °· Do not drive, use machinery, or play sports until your health care provider says it is okay. °· Keep all follow-up appointments as directed by your health care provider. °· Lie down right away if you start feeling like you might faint. Breathe deeply and steadily. Wait until all the symptoms have passed. °· Drink enough fluids to keep your urine clear or pale yellow. °· If you are taking blood pressure or heart medicine, get up slowly and take several minutes to sit and then stand. This can reduce dizziness. °SEEK IMMEDIATE MEDICAL CARE IF:  °· You have a severe headache. °· You have unusual pain in the chest, abdomen, or back. °· You are bleeding from your mouth or rectum, or you have black or tarry stool. °· You have an irregular or very fast heartbeat. °· You have pain with breathing. °· You have repeated fainting or seizure-like jerking during an episode. °· You faint when sitting or lying down. °· You have confusion. °· You have trouble walking. °· You have severe weakness. °· You have vision problems. °If you fainted, call your local emergency services (911 in [place] Do not drive   yourself to the hospital.    This information is not intended to replace advice given to you by your health care provider. Make sure you discuss any questions you have with your health care provider.   Document Released: 09/06/2005 Document Revised: 01/21/2015 Document Reviewed: 11/05/2011 Elsevier Interactive Patient Education 2016 [employer]   Staple removal 1 week.

## 2016-03-31 NOTE — ED Notes (Signed)
Pt presents to ED with c/o syncopal episode with an unwitnessed fall and head injury. Pt states he got lightheaded and dizzy and passed out; denies any N/V prior to episode, but does reports some HA. Pt presents with head wrapped in gauze and reports head laceration; bleeding controlled at this time. Pt also [facility] and provides employer info. Pt is A&O, in NAD. With respirations even, regular and unlabored.

## 2016-03-31 NOTE — Discharge Instructions (Signed)
Patient refused to go to the ER now even if we provided transport. Patient signed AMA.

## 2016-03-31 NOTE — ED Notes (Signed)
Patient complains of head injury at work. 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 Patient states that he was at work and then woke up on the floor with people around him. Patient has bleeding from back of the head. Patient states that he hit his head on the floor. Patient states that he has been having dizziness and some frontal head pain since injury. Patient complains of right elbow pain as well. Patient states that he does remember being dizzy before he passed out.

## 2016-04-02 ENCOUNTER — Ambulatory Visit
Admission: EM | Admit: 2016-04-02 | Discharge: 2016-04-02 | Disposition: A | Discharge status: Home / Self Care | Payer: Worker's Compensation

## 2016-04-02 NOTE — ED Notes (Addendum)
Patient here for urine drug screen test only.  [facility].  Urine drug screen collected per protocol and placed Occupational Health Nurse to sent for processing.

## 2016-04-28 ENCOUNTER — Ambulatory Visit
Admission: EM | Admit: 2016-04-28 | Discharge: 2016-04-28 | Disposition: A | Discharge status: Home / Self Care | Payer: Worker's Compensation | Attending: Emergency Medicine | Admitting: Emergency Medicine

## 2016-04-28 DIAGNOSIS — Z4802 Encounter for removal of sutures: Secondary | ICD-10-CM | POA: Diagnosis not present

## 2016-04-28 DIAGNOSIS — Z5189 Encounter for other specified aftercare: Secondary | ICD-10-CM

## 2016-04-28 NOTE — ED Provider Notes (Signed)
CSN: [id number]     Arrival date & time 04/28/16  1509 History  Pt here for staple removal. No fever, pain or drainage. Initially seen in UC on 03/31/16(28 days prior), signed out AMA and staples were placed at [facility] on this date. Pt states d/t transportation/work schedule has not had a chance to follow up and have staple removal.    None    Chief Complaint  Patient presents with  . Suture / Staple Removal   (Consider location/radiation/quality/duration/timing/severity/associated sxs/prior Treatment) HPI  Past Medical History:  Diagnosis Date  . HIV antibody positive (HCC)   . Immune deficiency disorder [facility](HCC)    Past Surgical History:  Procedure Laterality Date  . HERNIA REPAIR     History reviewed. No pertinent family history. Social History  Substance Use Topics  . Smoking status: Never Smoker  . Smokeless tobacco: Never Used  . Alcohol use No    Review of Systems  All other systems reviewed and are negative.   Allergies  Penicillins  Home Medications   Prior to Admission medications   Medication Sig Start Date End Date Taking? Authorizing Provider  efavirenz-emtricitabine-tenofovir (ATRIPLA) 600-200-300 MG tablet Take 1 tablet by mouth at bedtime.    Historical Provider, MD  guaiFENesin-codeine (ROBITUSSIN AC) 100-10 MG/5ML syrup Take 5 mLs by mouth 3 (three) times daily as needed for cough. 12/20/15   Chinita Pesterari B Triplett, FNP  ondansetron (ZOFRAN) 4 MG tablet Take 1 tablet (4 mg total) by mouth every 8 (eight) hours as needed for nausea or vomiting. 12/20/15   Chinita Pesterari B Triplett, FNP   Meds Ordered and Administered this Visit  Medications - No data to display  BP 139/86 (BP Location: Left Arm)   Pulse 81   Temp 98.1 F (36.7 C) (Oral)   Resp 18   Ht 5\' 6"  (1.676 m)   Wt 155 lb (70.3 kg)   SpO2 99%   BMI 25.02 kg/m  No data found.   Physical Exam  Constitutional: He is oriented to person, place, and time. He appears well-developed.  HENT:  Head:     Neurological: He is alert and oriented to person, place, and time.  Nursing note and vitals reviewed.   Urgent Care Course   Clinical Course    .Suture Removal Date/Time: 04/28/2016 3:30 PM Performed by: Eulamae Greenstein Authorized by: Clancy GourdEFELICE, Moxon Messler   Consent:    Consent obtained:  Verbal   Consent given by:  Patient   Risks discussed:  Wound separation, pain and bleeding Location:    Location:  Head/neck   Head/neck location:  Scalp Procedure details:    Wound appearance:  No signs of infection   Number of staples removed:  5 Post-procedure details:    Post-removal:  No dressing applied   Patient tolerance of procedure:  Tolerated well, no immediate complications Comments:     Staples removed by thsi provider, JEWD, FNP-BC    (including critical care time)  Labs Review Labs Reviewed - No data to display  Imaging Review No results found.    MDM   1. Visit for wound check   2. Encounter for staple removal      Clancy GourdJeanette Egypt Welcome, NP 04/28/16 1559

## 2016-04-28 NOTE — ED Notes (Signed)
Jeanette Defelice removed 5 staples from patient head.

## 2016-04-28 NOTE — ED Triage Notes (Signed)
Staple Removal

## 2016-06-17 ENCOUNTER — Encounter: Payer: Self-pay | Admitting: Emergency Medicine

## 2016-06-17 ENCOUNTER — Observation Stay
Admission: EM | Admit: 2016-06-17 | Discharge: 2016-06-18 | Disposition: A | Discharge status: Home / Self Care | Payer: No Typology Code available for payment source | Attending: Specialist | Admitting: Specialist

## 2016-06-17 ENCOUNTER — Emergency Department: Payer: No Typology Code available for payment source

## 2016-06-17 DIAGNOSIS — IMO0002 Reserved for concepts with insufficient information to code with codable children: Secondary | ICD-10-CM

## 2016-06-17 DIAGNOSIS — T148 Other injury of unspecified body region: Secondary | ICD-10-CM | POA: Diagnosis present

## 2016-06-17 DIAGNOSIS — R262 Difficulty in walking, not elsewhere classified: Secondary | ICD-10-CM

## 2016-06-17 DIAGNOSIS — Z880 Allergy status to penicillin: Secondary | ICD-10-CM | POA: Insufficient documentation

## 2016-06-17 DIAGNOSIS — Z21 Asymptomatic human immunodeficiency virus [HIV] infection status: Secondary | ICD-10-CM | POA: Insufficient documentation

## 2016-06-17 DIAGNOSIS — S32000A Wedge compression fracture of unspecified lumbar vertebra, initial encounter for closed fracture: Secondary | ICD-10-CM | POA: Diagnosis present

## 2016-06-17 DIAGNOSIS — S22089A Unspecified fracture of T11-T12 vertebra, initial encounter for closed fracture: Principal | ICD-10-CM | POA: Insufficient documentation

## 2016-06-17 DIAGNOSIS — S32019A Unspecified fracture of first lumbar vertebra, initial encounter for closed fracture: Secondary | ICD-10-CM | POA: Insufficient documentation

## 2016-06-17 DIAGNOSIS — M545 Low back pain: Secondary | ICD-10-CM

## 2016-06-17 DIAGNOSIS — J45909 Unspecified asthma, uncomplicated: Secondary | ICD-10-CM | POA: Insufficient documentation

## 2016-06-17 HISTORY — DX: Unspecified asthma, uncomplicated: J45.909

## 2016-06-17 HISTORY — DX: Late syphilis, unspecified: A52.9

## 2016-06-17 LAB — CBC
HEMATOCRIT: 40.3 % (ref 40.0–52.0)
Hemoglobin: 14.2 g/dL (ref 13.0–18.0)
MCH: 32.2 pg (ref 26.0–34.0)
MCHC: 35.3 g/dL (ref 32.0–36.0)
MCV: 91.1 fL (ref 80.0–100.0)
PLATELETS: 192 10*3/uL (ref 150–440)
RBC: 4.42 MIL/uL (ref 4.40–5.90)
RDW: 12.7 % (ref 11.5–14.5)
WBC: 5.6 10*3/uL (ref 3.8–10.6)

## 2016-06-17 LAB — CREATININE, SERUM
CREATININE: 0.8 mg/dL (ref 0.61–1.24)
GFR calc Af Amer: >60 mL/min (ref >60)
GFR calc non Af Amer: >60 mL/min (ref >60)

## 2016-06-17 MED ORDER — ACETAMINOPHEN 325 MG PO TABS: 650.0000 mg | ORAL_TABLET | Freq: Four times a day (QID) | ORAL | Status: DC | PRN

## 2016-06-17 MED ORDER — ONDANSETRON HCL 4 MG/2ML IJ SOLN
4.0000 mg | Freq: Once | INTRAMUSCULAR | Status: AC
  Administered 2016-06-17: 4 mg via INTRAVENOUS
  Filled 2016-06-17: qty 2

## 2016-06-17 MED ORDER — SODIUM CHLORIDE 0.9% FLUSH: 3.0000 mL | INTRAVENOUS | Status: DC | PRN

## 2016-06-17 MED ORDER — SODIUM CHLORIDE 0.9% FLUSH
3.0000 mL | Freq: Two times a day (BID) | INTRAVENOUS | Status: DC
  Administered 2016-06-17: 3 mL via INTRAVENOUS

## 2016-06-17 MED ORDER — EFAVIRENZ-EMTRICITAB-TENOFOVIR 600-200-300 MG PO TABS
1.0000 | ORAL_TABLET | Freq: Every day | ORAL | Status: DC
  Administered 2016-06-17: 1 via ORAL
  Filled 2016-06-17 (×2): qty 1

## 2016-06-17 MED ORDER — ALBUTEROL SULFATE (2.5 MG/3ML) 0.083% IN NEBU: 2.5000 mg | INHALATION_SOLUTION | RESPIRATORY_TRACT | Status: DC | PRN

## 2016-06-17 MED ORDER — ACETAMINOPHEN 650 MG RE SUPP: 650.0000 mg | Freq: Four times a day (QID) | RECTAL | Status: DC | PRN

## 2016-06-17 MED ORDER — KETOROLAC TROMETHAMINE 30 MG/ML IJ SOLN
30.0000 mg | Freq: Once | INTRAMUSCULAR | Status: AC
  Administered 2016-06-17: 30 mg via INTRAMUSCULAR
  Filled 2016-06-17: qty 1

## 2016-06-17 MED ORDER — SODIUM CHLORIDE 0.9 % IV SOLN: 250.0000 mL | INTRAVENOUS | Status: DC | PRN

## 2016-06-17 MED ORDER — ONDANSETRON HCL 4 MG/2ML IJ SOLN: 4.0000 mg | Freq: Four times a day (QID) | INTRAMUSCULAR | Status: DC | PRN

## 2016-06-17 MED ORDER — HYDROMORPHONE HCL 1 MG/ML IJ SOLN
1.0000 mg | Freq: Once | INTRAMUSCULAR | Status: AC
  Administered 2016-06-17: 1 mg via INTRAVENOUS
  Filled 2016-06-17: qty 1

## 2016-06-17 MED ORDER — ONDANSETRON HCL 4 MG PO TABS: 4.0000 mg | ORAL_TABLET | Freq: Four times a day (QID) | ORAL | Status: DC | PRN

## 2016-06-17 MED ORDER — TRAMADOL HCL 50 MG PO TABS
50.0000 mg | ORAL_TABLET | Freq: Four times a day (QID) | ORAL | Status: DC | PRN
  Administered 2016-06-17 – 2016-06-18 (×2): 50 mg via ORAL
  Filled 2016-06-17 (×2): qty 1

## 2016-06-17 MED ORDER — HYDROCODONE-ACETAMINOPHEN 5-325 MG PO TABS
1.0000 | ORAL_TABLET | Freq: Once | ORAL | Status: AC
  Administered 2016-06-17: 1 via ORAL
  Filled 2016-06-17: qty 1

## 2016-06-17 MED ORDER — LIDOCAINE 5 % EX PTCH
1.0000 | MEDICATED_PATCH | CUTANEOUS | Status: DC
  Administered 2016-06-17 – 2016-06-18 (×2): 1 via TRANSDERMAL
  Filled 2016-06-17 (×3): qty 1

## 2016-06-17 MED ORDER — ENOXAPARIN SODIUM 40 MG/0.4ML ~~LOC~~ SOLN
40.0000 mg | SUBCUTANEOUS | Status: DC
Start: 2016-06-17 — End: 2016-06-18
  Administered 2016-06-17: 40 mg via SUBCUTANEOUS
  Filled 2016-06-17: qty 0.4

## 2016-06-17 MED ORDER — KETOROLAC TROMETHAMINE 30 MG/ML IJ SOLN
30.0000 mg | Freq: Four times a day (QID) | INTRAMUSCULAR | Status: DC | PRN
  Administered 2016-06-17: 30 mg via INTRAVENOUS
  Filled 2016-06-17: qty 1
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
## 2016-06-17 NOTE — ED Notes (Signed)
MD at bedside to discuss xray results
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
## 2016-06-17 NOTE — Discharge Instructions (Signed)
Please take ibuprofen around the clock and supplement with Tylenol. Bed rest and return here for is any other new concerns.

## 2016-06-17 NOTE — ED Notes (Signed)
Tech transporting pt to room 159

## 2016-06-17 NOTE — ED Notes (Signed)
Report called to [employer]Kelly RN on 1A   - Pam to transport
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
## 2016-06-17 NOTE — ED Notes (Signed)
MD at bedside. Dr. Huel CoteQuigley in room to assess patient at this time.

## 2016-06-18 MED ORDER — TRAMADOL HCL 50 MG PO TABS: 50.0000 mg | ORAL_TABLET | Freq: Four times a day (QID) | ORAL | 0 refills | Status: AC | PRN

## 2016-06-18 NOTE — Progress Notes (Signed)
Pt being discharged home today. PIV removed. Discharge instructions were reviewed with pt and all questions answered. He Will follow up with Orthopedics. Prescriptions were given to pt to have filled. He is leaving with all his belongings. Will be transported home via family member.
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
## 2016-06-18 NOTE — Progress Notes (Signed)
  June 18, 2016  Patient: Eddie PrimasRussell B Mick  Date of Birth: 05/12/1966  Date of Visit: 06/17/2016    To Whom It May Concern:  Clelia CroftRussell Brosky was seen and treated in our emergency department or urgent care center on 06/17/2016 and released on 06/18/2016. Eddie Primasussell B Mcdonell . He may return to work when cleared by Orthopedic Specialist.   Sincerely,    Marya LandryARMC Sainani, MD
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
## 2018-01-20 ENCOUNTER — Emergency Department: Payer: BLUE CROSS/BLUE SHIELD

## 2018-01-20 ENCOUNTER — Other Ambulatory Visit: Payer: Self-pay

## 2018-01-20 ENCOUNTER — Emergency Department
Admission: EM | Admit: 2018-01-20 | Discharge: 2018-01-20 | Disposition: A | Discharge status: Home / Self Care | Payer: BLUE CROSS/BLUE SHIELD | Attending: Emergency Medicine | Admitting: Emergency Medicine

## 2018-01-20 DIAGNOSIS — Z79899 Other long term (current) drug therapy: Secondary | ICD-10-CM | POA: Insufficient documentation

## 2018-01-20 DIAGNOSIS — J40 Bronchitis, not specified as acute or chronic: Secondary | ICD-10-CM

## 2018-01-20 DIAGNOSIS — R05 Cough: Secondary | ICD-10-CM | POA: Diagnosis present

## 2018-01-20 MED ORDER — DOXYCYCLINE HYCLATE 100 MG PO TABS
100.0000 mg | ORAL_TABLET | Freq: Once | ORAL | Status: AC
  Administered 2018-01-20: 100 mg via ORAL
  Filled 2018-01-20: qty 1

## 2018-01-20 MED ORDER — ALBUTEROL SULFATE HFA 108 (90 BASE) MCG/ACT IN AERS: 2.0000 | INHALATION_SPRAY | Freq: Four times a day (QID) | RESPIRATORY_TRACT | 0 refills | Status: AC | PRN

## 2018-01-20 MED ORDER — DOXYCYCLINE HYCLATE 100 MG PO CAPS: 100.0000 mg | ORAL_CAPSULE | Freq: Two times a day (BID) | ORAL | 0 refills | Status: AC

## 2018-01-20 NOTE — ED Provider Notes (Signed)
[facility] Emergency Department Provider Note  ____________________________________________   First MD Initiated Contact with Patient 01/20/18 2035     (approximate)  I have reviewed the triage vital signs and the nursing notes.   HISTORY  Chief Complaint Cough   HPI Eddie Hamilton is a 52 y.o. male with a history of HIV, compliant with his medications, who is presenting to the emergency department with 5 days of productive cough.  He says the cough is productive of yellow sputum.  Denies any pain.  Denies shortness of breath.  Says that he has had night sweats but think this is more related to the increasing temperature and not from infection.  Says that he was recently at [facility] to visit his infectious disease doctor on 24 April and was found to have an undetectable viral load.  I reviewed the records and he also had a normal white blood cell count at that time.  Patient says that he has a history of bronchitis.  Does not smoke.   Past Medical History:  Diagnosis Date  . Asthma   . HIV antibody positive (HCC)   . Immune deficiency disorder (HCC)   . Late syphilis     Patient Active Problem List   Diagnosis Date Noted  . Lumbar compression fracture (HCC) 06/17/2016  . NEUROSYPHILIS 05/18/2010  . DVT 05/18/2010  . HIV INFECTION 04/03/2009    Past Surgical History:  Procedure Laterality Date  . DENTAL SURGERY    . HERNIA REPAIR      Prior to Admission medications   Medication Sig Start Date End Date Taking? Authorizing Provider  efavirenz-emtricitabine-tenofovir (ATRIPLA) 600-200-300 MG tablet Take 1 tablet by mouth at bedtime.    [provider]  traMADol (ULTRAM) 50 MG tablet Take 1 tablet (50 mg total) by mouth every 6 (six) hours as needed for moderate pain. 06/18/16   Houston Siren, MD    Allergies Penicillins  Family History  Problem Relation Age of Onset  . Cancer Mother   . Diabetes Mother   . Cancer Father       Social History Social History   Tobacco Use  . Smoking status: Never Smoker  . Smokeless tobacco: Never Used  Substance Use Topics  . Alcohol use: No    Alcohol/week: 0.0 oz  . Drug use: No    Review of Systems  Constitutional: No fever/chills Eyes: No visual changes. ENT: No sore throat. Cardiovascular: Denies chest pain. Respiratory: As above Gastrointestinal: No abdominal pain.  No nausea, no vomiting.  No diarrhea.  No constipation. Genitourinary: Negative for dysuria. Musculoskeletal: Negative for back pain. Skin: Negative for rash. Neurological: Negative for headaches, focal weakness or numbness.   ____________________________________________   PHYSICAL EXAM:  VITAL SIGNS: ED Triage Vitals [01/20/18 2006]  Enc Vitals Group     BP 115/75     Pulse Rate 84     Resp 16     Temp 98.1 F (36.7 C)     Temp Source Oral     SpO2 100 %     Weight 147 lb (66.7 kg)     Height  (1.778 m)     Head Circumference      Peak Flow      Pain Score 2     Pain Loc      Pain Edu?      Excl. in GC?     Constitutional: Alert and oriented. Well appearing and in no acute  distress. Eyes: Conjunctivae are normal.  Head: Atraumatic. Nose: No congestion/rhinnorhea. Mouth/Throat: Mucous membranes are moist.  Neck: No stridor.   Cardiovascular: Normal rate, regular rhythm. Grossly normal heart sounds.   Respiratory: Normal respiratory effort.  No retractions. Lungs CTAB. Gastrointestinal: Soft and nontender. No distention. Musculoskeletal: No lower extremity tenderness nor edema.  No joint effusions. Neurologic:  Normal speech and language. No gross focal neurologic deficits are appreciated. Skin:  Skin is warm, dry and intact. No rash noted. Psychiatric: Mood and affect are normal. Speech and behavior are normal.  ____________________________________________   LABS (all labs ordered are listed, but only abnormal results are displayed)  Labs Reviewed - No data  to display ____________________________________________  EKG   ____________________________________________  RADIOLOGY  Chest x-ray with mild bronchitic change. ____________________________________________   PROCEDURES  Procedure(s) performed:   Procedures  Critical Care performed:   ____________________________________________   INITIAL IMPRESSION / ASSESSMENT AND PLAN / ED COURSE  Pertinent labs & imaging results that were available during my care of the patient were reviewed by me and considered in my medical decision making (see chart for details).  Differential includes, but is not limited to, viral syndrome, bronchitis including COPD exacerbation, pneumonia, reactive airway disease including asthma, CHF including exacerbation with or without pulmonary/interstitial edema, pneumothorax, ACS, thoracic trauma, and pulmonary embolism. As part of my medical decision making, I reviewed the following data within the electronic MEDICAL RECORD NUMBERReviewed recent records from [facility].  Patient with evidence of bronchitis x1 week.  History of HIV.  Appears to be immunocompetent.  However, I believe she we should treat him aggressively.  Will be discharged with doxycycline as well as an albuterol inhaler.  Patient understanding of the plan and willing to comply. ____________________________________________   FINAL CLINICAL IMPRESSION(S) / ED DIAGNOSES  Bronchitis.    NEW MEDICATIONS STARTED DURING THIS VISIT:  New Prescriptions   No medications on file     Note:  This document was prepared using Dragon voice recognition software and may include unintentional dictation errors.     Myrna Blazer, MD 01/20/18 2155

## 2018-01-20 NOTE — ED Triage Notes (Signed)
Pt states yellow sputum production cough for one week. Pt with strong cough noted in triage. Pt denies known fever, but states has had "night sweats the last two nights". Pt appears in no acute distress.

## 2018-01-20 NOTE — ED Triage Notes (Signed)
Pt is HIV positive.

## 2018-01-20 NOTE — ED Notes (Signed)
Spoke with dr. Derrill Kay for orders, order for cxr received.

## 2019-10-25 IMAGING — CR DG CHEST 2V
2 series · 2 of 2 positions shown · non-contrast
Comparison: CT 06/17/2017

CLINICAL DATA: Yellow sputum with cough

EXAM:
CHEST - 2 VIEW

[chest pa]
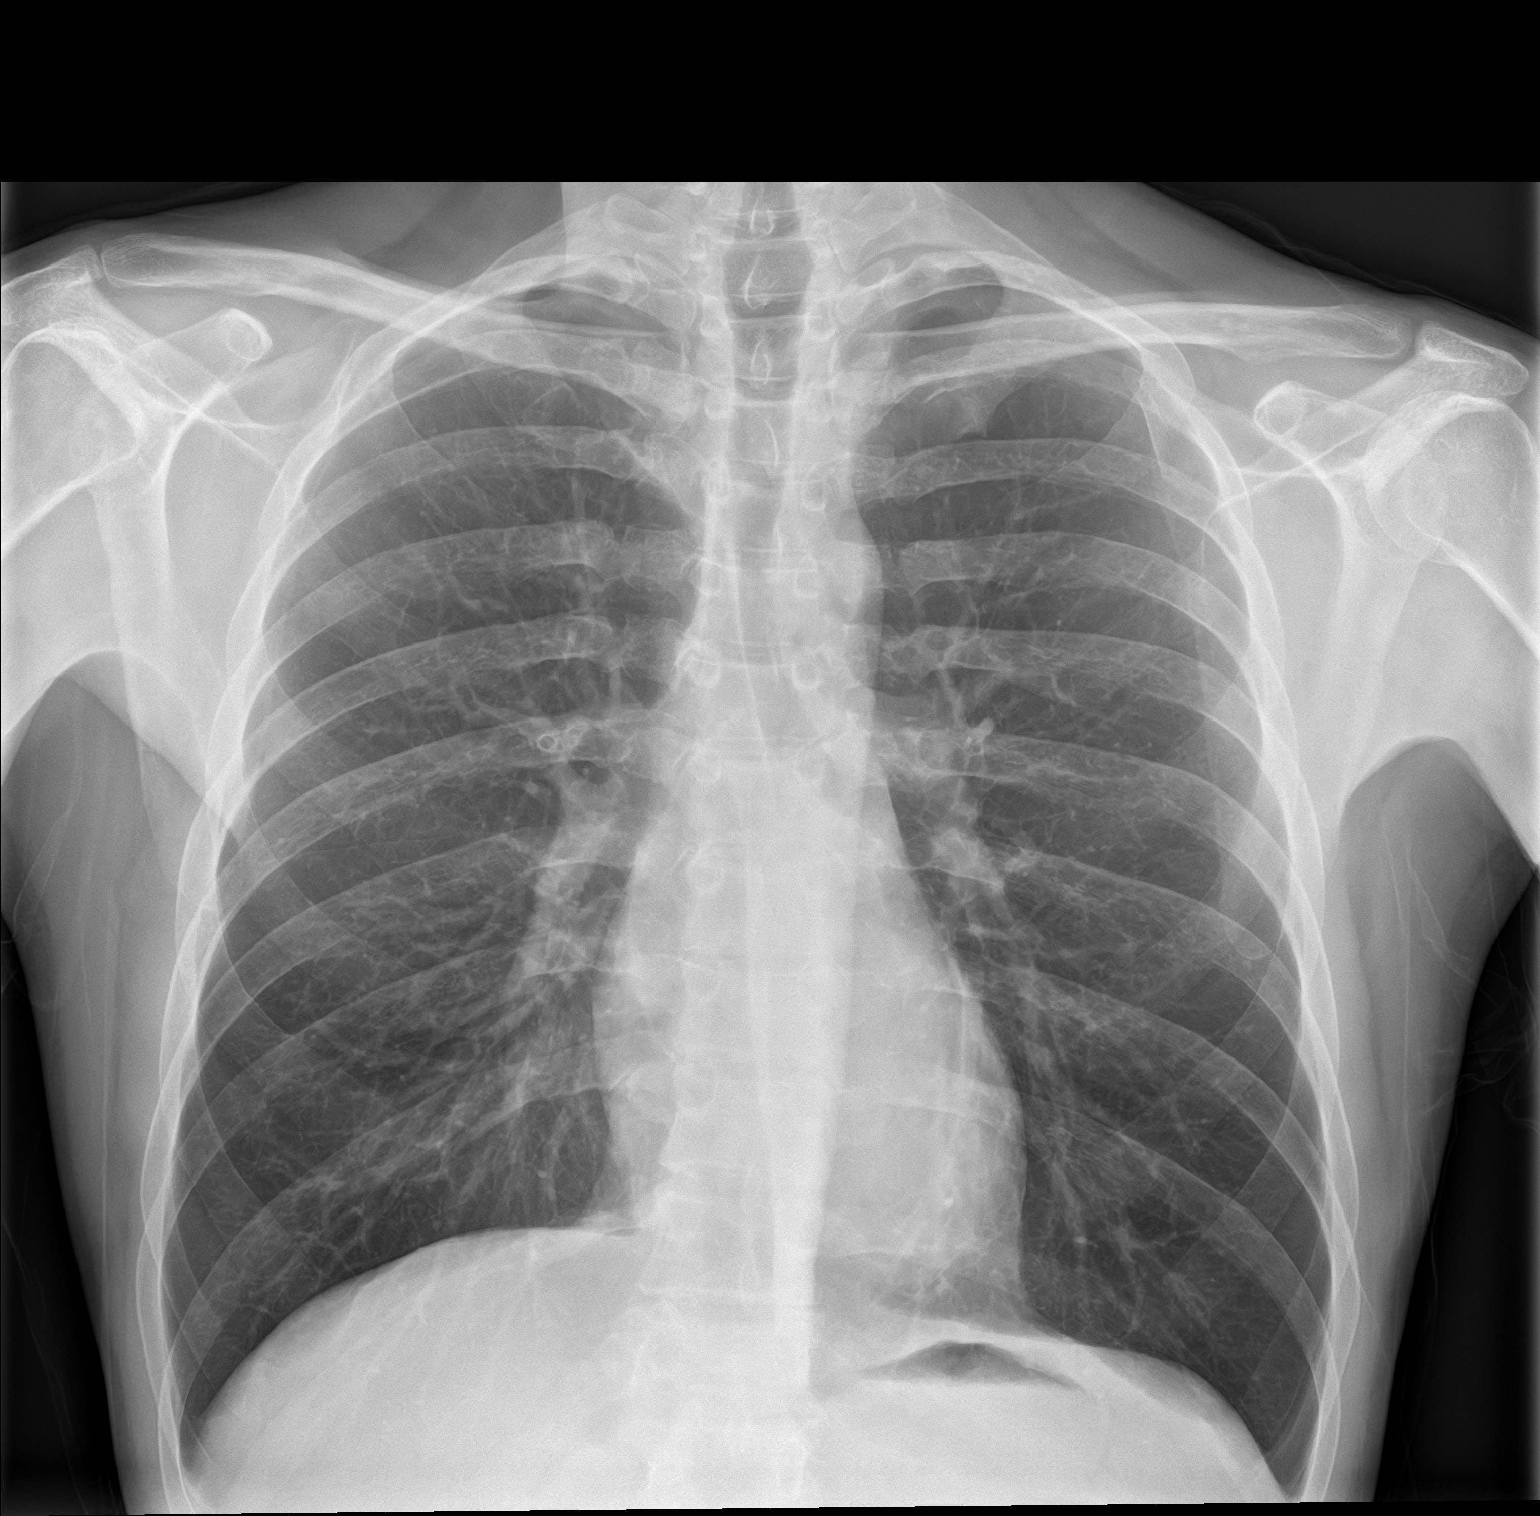

[chest lat]
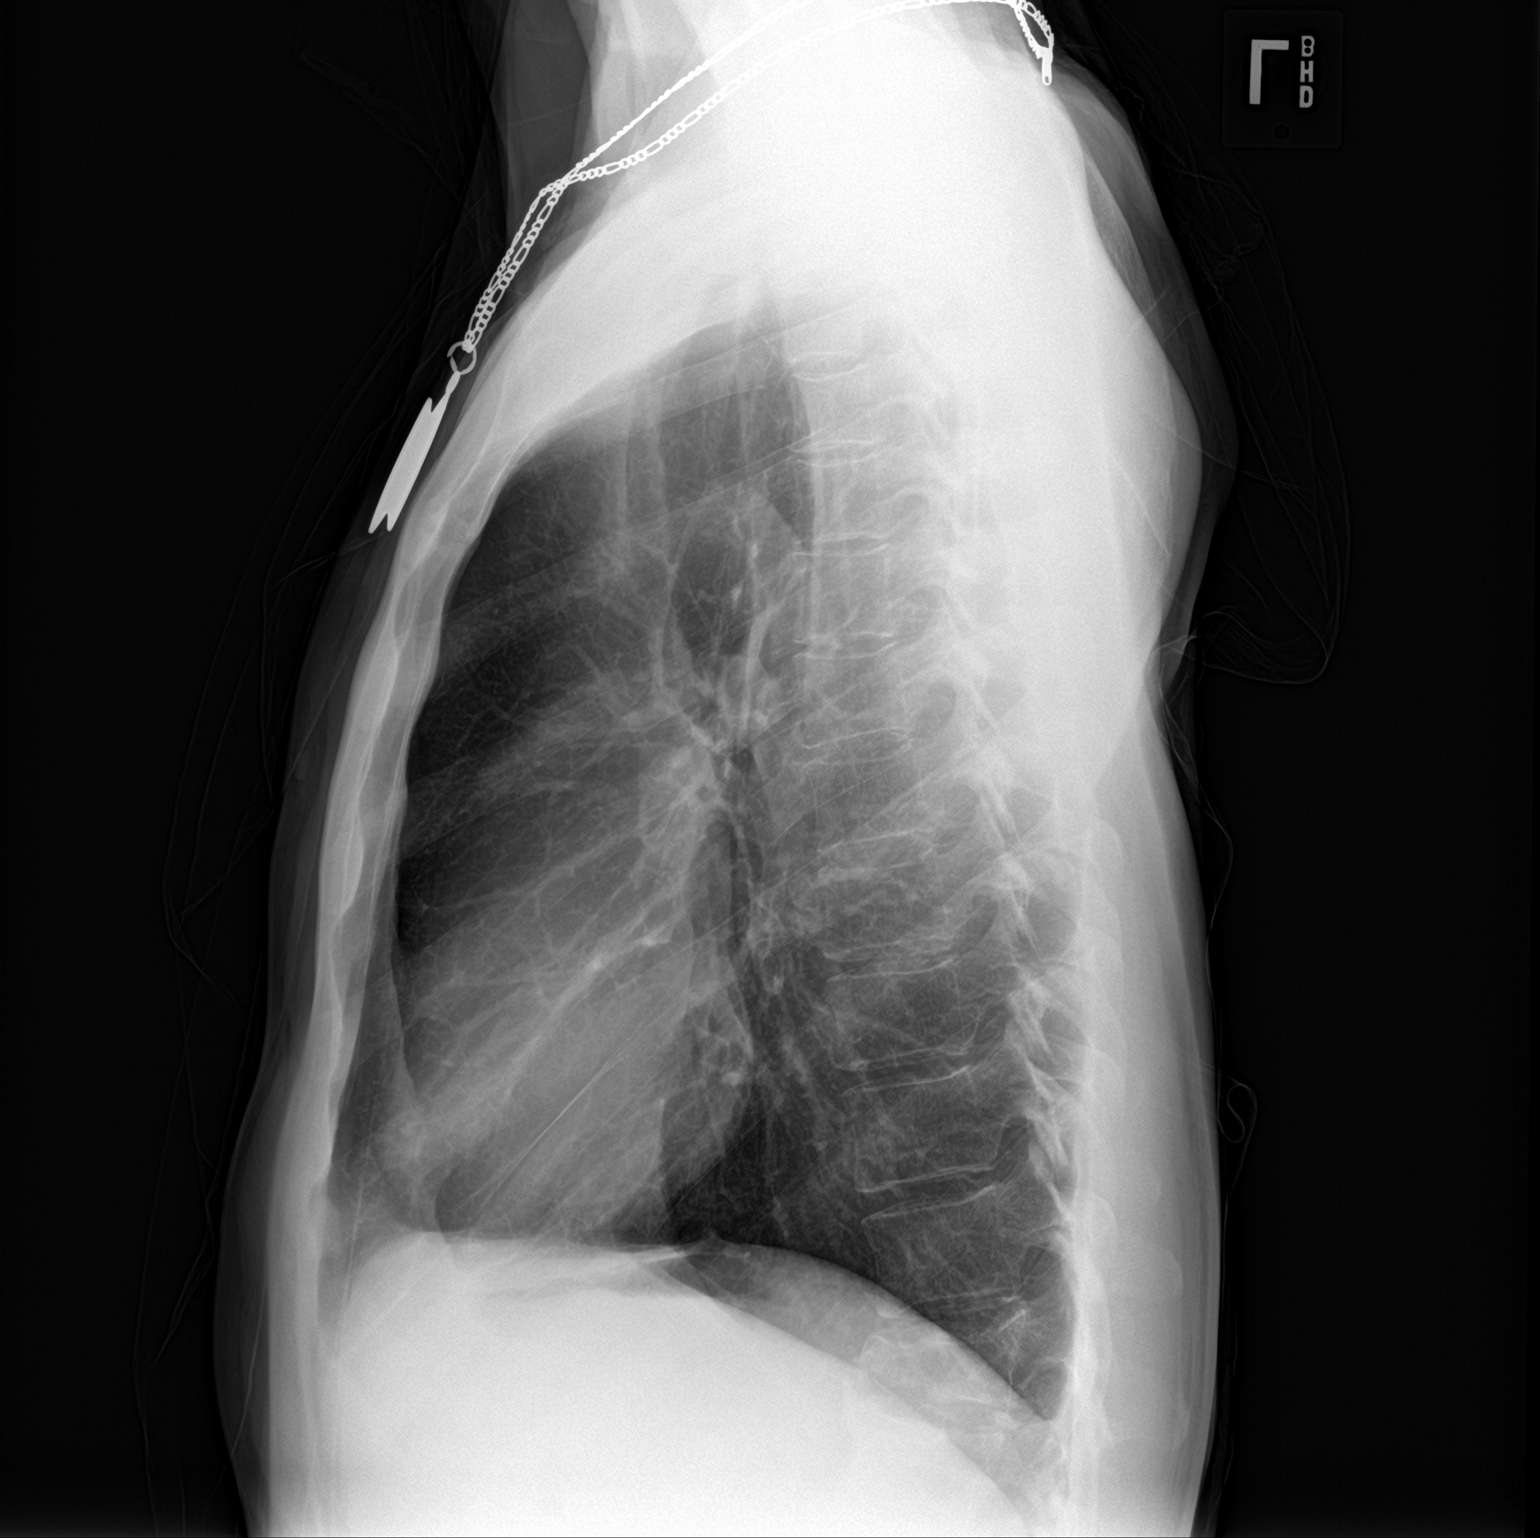

[2 of 2 positions shown; findings below may reference images not displayed]

FINDINGS: The heart size and mediastinal contours are within normal limits.
Mild bronchitic changes. No focal infiltrate or effusion. No
pneumothorax. Moderate to marked chronic compression deformity of
lower thoracic vertebra
IMPRESSION: 1. No acute pulmonary infiltrate.  Mild bronchitic change

## 2020-09-25 ENCOUNTER — Other Ambulatory Visit: Payer: Self-pay

## 2020-09-25 ENCOUNTER — Emergency Department
Admission: EM | Admit: 2020-09-25 | Discharge: 2020-09-25 | Disposition: A | Discharge status: Home / Self Care | Payer: BC Managed Care – PPO | Attending: Emergency Medicine | Admitting: Emergency Medicine

## 2020-09-25 ENCOUNTER — Encounter: Payer: Self-pay | Admitting: Emergency Medicine

## 2020-09-25 DIAGNOSIS — U071 COVID-19: Secondary | ICD-10-CM | POA: Insufficient documentation

## 2020-09-25 DIAGNOSIS — R509 Fever, unspecified: Secondary | ICD-10-CM | POA: Diagnosis present

## 2020-09-25 DIAGNOSIS — B349 Viral infection, unspecified: Secondary | ICD-10-CM

## 2020-09-25 DIAGNOSIS — J45909 Unspecified asthma, uncomplicated: Secondary | ICD-10-CM | POA: Diagnosis not present

## 2020-09-25 LAB — SARS CORONAVIRUS 2 (TAT 6-24 HRS): SARS Coronavirus 2: POSITIVE — AB

## 2020-09-25 MED ORDER — AZITHROMYCIN 250 MG PO TABS: ORAL_TABLET | ORAL | 0 refills | Status: AC

## 2020-09-25 NOTE — ED Provider Notes (Signed)
[facility] Emergency Department Provider Note  ____________________________________________  Time seen: Approximately 10:31 AM  I have reviewed the triage vital signs and the nursing notes.   HISTORY  Chief Complaint Generalized Body Aches, Fever, and Covid Test   HPI Eddie Hamilton is a 55 y.o. male who presents to the emergency department for COVID testing. Symptoms started 3 days ago with fever, body aches, cough, and diarrhea. No alleviating measures prior to arrival.    Past Medical History:  Diagnosis Date  . Asthma   . HIV antibody positive (HCC)   . Immune deficiency disorder (HCC)   . Late syphilis     Patient Active Problem List   Diagnosis Date Noted  . Lumbar compression fracture (HCC) 06/17/2016  . NEUROSYPHILIS 05/18/2010  . DVT 05/18/2010  . HIV INFECTION 04/03/2009    Past Surgical History:  Procedure Laterality Date  . DENTAL SURGERY    . HERNIA REPAIR      Prior to Admission medications   Medication Sig Start Date End Date Taking? Authorizing Provider  azithromycin (ZITHROMAX) 250 MG tablet 2 tablets today, then 1 tablet for the next 4 days. 09/25/20  Yes Marston Mccadden B, FNP  albuterol (PROVENTIL HFA;VENTOLIN HFA) 108 (90 Base) MCG/ACT inhaler Inhale 2 puffs into the lungs every 6 (six) hours as needed. 01/20/18   Schaevitz, Myra Rude, MD  doxycycline (VIBRAMYCIN) 100 MG capsule Take 1 capsule (100 mg total) by mouth 2 (two) times daily. 01/20/18   Myrna Blazer, MD  efavirenz-emtricitabine-tenofovir (ATRIPLA) 600-200-300 MG tablet Take 1 tablet by mouth at bedtime.    [provider]  traMADol (ULTRAM) 50 MG tablet Take 1 tablet (50 mg total) by mouth every 6 (six) hours as needed for moderate pain. 06/18/16   Houston Siren, MD    Allergies Penicillins  Family History  Problem Relation Age of Onset  . Cancer Mother   . Diabetes Mother   . Cancer Father     Social History Social History    Tobacco Use  . Smoking status: Never Smoker  . Smokeless tobacco: Never Used  Substance Use Topics  . Alcohol use: No    Alcohol/week: 0.0 standard drinks  . Drug use: No    Review of Systems Constitutional: Positive for fever/chills. Normal appetite. ENT: No sore throat. Cardiovascular: Denies chest pain. Respiratory: Negative shortness of breath. Positive for cough. Negative for wheezing.  Gastrointestinal: No nausea,  no vomiting.  Negative diarrhea.  Musculoskeletal: Positive for body aches Skin: Negative for rash. Neurological: Positive for headaches ____________________________________________   PHYSICAL EXAM:  VITAL SIGNS: ED Triage Vitals [09/25/20 0820]  Enc Vitals Group     BP (!) 129/92     Pulse Rate 74     Resp 20     Temp 98.3 F (36.8 C)     Temp Source Oral     SpO2 96 %     Weight 140 lb (63.5 kg)     Height 5\' 10"  (1.778 m)     Head Circumference      Peak Flow      Pain Score 0     Pain Loc      Pain Edu?      Excl. in GC?     Constitutional: Alert and oriented. Overall well appearing and in no acute distress. Eyes: Conjunctivae are normal. Ears: TM normal Nose: No sinus congestion noted; no rhinnorhea. Mouth/Throat: Mucous membranes are moist.  Oropharynx clear. Tonsils without exudate.  Uvula midline. Neck: No stridor.  Lymphatic: No cervical lymphadenopathy. Cardiovascular: Normal rate, regular rhythm. Good peripheral circulation. Respiratory: Respirations are even and unlabored.  No retractions. Breath sounds clear. Gastrointestinal: Soft and nontender.  Musculoskeletal: FROM x 4 extremities.  Neurologic:  Normal speech and language. Skin:  Skin is warm, dry and intact. No rash noted. Psychiatric: Mood and affect are normal. Speech and behavior are normal.  ____________________________________________   LABS (all labs ordered are listed, but only abnormal results are displayed)  Labs Reviewed  SARS CORONAVIRUS 2 (TAT 6-24 HRS)    ____________________________________________  EKG  Not indicated. ____________________________________________  RADIOLOGY  Not indicated. ____________________________________________   PROCEDURES  Procedure(s) performed: None  Critical Care performed: No ____________________________________________   INITIAL IMPRESSION / ASSESSMENT AND PLAN / ED COURSE  55 y.o. male presents to the emergency department for COVID testing. See HPI. Exam is overall reassuring. Results should be back within 24 hours. Because of his immunodeficiency, he will be prescribed azithromycin. He is to quarantine until test is back and then 7 days if positive.     Medications - No data to display  ED Discharge Orders         Ordered    azithromycin (ZITHROMAX) 250 MG tablet        09/25/20 1036           Pertinent labs & imaging results that were available during my care of the patient were reviewed by me and considered in my medical decision making (see chart for details).    If controlled substance prescribed during this visit, 12 month history viewed on the [facility] prior to issuing an initial prescription for Schedule II or III opiod. ____________________________________________   FINAL CLINICAL IMPRESSION(S) / ED DIAGNOSES  Final diagnoses:  Viral illness    Note:  This document was prepared using Dragon voice recognition software and may include unintentional dictation errors.    Chinita Pester, FNP 09/25/20 1046    Concha Se, MD 09/25/20 1806

## 2020-09-25 NOTE — ED Triage Notes (Signed)
Pt to ED via POV with c/o fevers/generalized body aches/ and some diarrhea since last night, pt states here because he knows his boss will want a covid test. Pt A&O x4, NAD noted in triage, ambualtory without difficulty.

## 2020-09-25 NOTE — Discharge Instructions (Addendum)
Follow up with primary care for symptoms that are not improving over the week.  Return to the ER for symptoms of concern if unable to see primary care.

## 2020-09-26 ENCOUNTER — Telehealth: Payer: Self-pay | Admitting: Adult Health

## 2020-09-26 NOTE — Telephone Encounter (Signed)
I connected by phone with Eddie Hamilton on 09/26/2020 at 4:27 PM to discuss the potential use of a new treatment for mild to moderate COVID-19 viral infection in non-hospitalized patients.  This patient is a 55 y.o. male that meets the FDA criteria for Emergency Use Authorization of COVID monoclonal antibody sotrovimab.  Has a (+) direct SARS-CoV-2 viral test result  Has mild or moderate COVID-19   Is NOT hospitalized due to COVID-19  Is within 10 days of symptom onset  Has at least one of the high risk factor(s) for progression to severe COVID-19 and/or hospitalization as defined in EUA.  Specific high risk criteria : Immunosuppressive Disease or Treatment, unvaccinated  Sx onset 1/3   I have spoken and communicated the following to the patient or parent/caregiver regarding COVID monoclonal antibody treatment:  1. FDA has authorized the emergency use for the treatment of mild to moderate COVID-19 in adults and pediatric patients with positive results of direct SARS-CoV-2 viral testing who are 59 years of age and older weighing at least 40 kg, and who are at high risk for progressing to severe COVID-19 and/or hospitalization.  2. The significant known and potential risks and benefits of COVID monoclonal antibody, and the extent to which such potential risks and benefits are unknown.  3. Information on available alternative treatments and the risks and benefits of those alternatives, including clinical trials.  4. Patients treated with COVID monoclonal antibody should continue to self-isolate and use infection control measures (e.g., wear mask, isolate, social distance, avoid sharing personal items, clean and disinfect "high touch" surfaces, and frequent handwashing) according to CDC guidelines.   5. The patient or parent/caregiver has the option to accept or refuse COVID monoclonal antibody treatment.  After reviewing this information with the patient, the patient has DECLINED offer  to receive the infusion. Noreene Filbert, NP 09/26/2020 4:27 PM

## 2023-09-16 DIAGNOSIS — B20 Human immunodeficiency virus [HIV] disease: Secondary | ICD-10-CM | POA: Diagnosis not present

## 2023-09-16 DIAGNOSIS — M25541 Pain in joints of right hand: Secondary | ICD-10-CM | POA: Diagnosis not present

## 2023-09-16 DIAGNOSIS — M25542 Pain in joints of left hand: Secondary | ICD-10-CM | POA: Diagnosis not present

## 2023-09-16 DIAGNOSIS — R85615 Unsatisfactory cytologic smear of anus: Secondary | ICD-10-CM | POA: Diagnosis not present

## 2023-09-16 DIAGNOSIS — B349 Viral infection, unspecified: Secondary | ICD-10-CM | POA: Diagnosis not present

## 2023-09-16 DIAGNOSIS — Z23 Encounter for immunization: Secondary | ICD-10-CM | POA: Diagnosis not present

## 2023-09-16 DIAGNOSIS — Z113 Encounter for screening for infections with a predominantly sexual mode of transmission: Secondary | ICD-10-CM | POA: Diagnosis not present

## 2023-09-16 DIAGNOSIS — Z79899 Other long term (current) drug therapy: Secondary | ICD-10-CM | POA: Diagnosis not present

## 2023-09-16 DIAGNOSIS — Z1212 Encounter for screening for malignant neoplasm of rectum: Secondary | ICD-10-CM | POA: Diagnosis not present

## 2023-09-16 DIAGNOSIS — Z1151 Encounter for screening for human papillomavirus (HPV): Secondary | ICD-10-CM | POA: Diagnosis not present

## 2023-09-16 DIAGNOSIS — Z1633 Resistance to antiviral drug(s): Secondary | ICD-10-CM | POA: Diagnosis not present

## 2023-09-16 DIAGNOSIS — Z9189 Other specified personal risk factors, not elsewhere classified: Secondary | ICD-10-CM | POA: Diagnosis not present

## 2023-09-16 DIAGNOSIS — K122 Cellulitis and abscess of mouth: Secondary | ICD-10-CM | POA: Diagnosis not present

## 2023-09-16 DIAGNOSIS — Z5181 Encounter for therapeutic drug level monitoring: Secondary | ICD-10-CM | POA: Diagnosis not present

## 2023-09-19 DIAGNOSIS — M85842 Other specified disorders of bone density and structure, left hand: Secondary | ICD-10-CM | POA: Diagnosis not present

## 2023-09-19 DIAGNOSIS — M8580 Other specified disorders of bone density and structure, unspecified site: Secondary | ICD-10-CM | POA: Diagnosis not present

## 2023-09-19 DIAGNOSIS — M2550 Pain in unspecified joint: Secondary | ICD-10-CM | POA: Diagnosis not present

## 2023-09-19 DIAGNOSIS — R7989 Other specified abnormal findings of blood chemistry: Secondary | ICD-10-CM | POA: Diagnosis not present

## 2023-09-19 DIAGNOSIS — M79641 Pain in right hand: Secondary | ICD-10-CM | POA: Diagnosis not present

## 2023-09-19 DIAGNOSIS — M791 Myalgia, unspecified site: Secondary | ICD-10-CM | POA: Diagnosis not present

## 2023-09-19 DIAGNOSIS — Z21 Asymptomatic human immunodeficiency virus [HIV] infection status: Secondary | ICD-10-CM | POA: Diagnosis not present

## 2023-09-19 DIAGNOSIS — R634 Abnormal weight loss: Secondary | ICD-10-CM | POA: Diagnosis not present

## 2023-09-19 DIAGNOSIS — M79642 Pain in left hand: Secondary | ICD-10-CM | POA: Diagnosis not present

## 2023-09-19 DIAGNOSIS — F4320 Adjustment disorder, unspecified: Secondary | ICD-10-CM | POA: Diagnosis not present

## 2023-09-19 DIAGNOSIS — M85841 Other specified disorders of bone density and structure, right hand: Secondary | ICD-10-CM | POA: Diagnosis not present

## 2023-10-14 DIAGNOSIS — Z202 Contact with and (suspected) exposure to infections with a predominantly sexual mode of transmission: Secondary | ICD-10-CM | POA: Diagnosis not present

## 2023-10-14 DIAGNOSIS — Z5181 Encounter for therapeutic drug level monitoring: Secondary | ICD-10-CM | POA: Diagnosis not present

## 2023-10-14 DIAGNOSIS — Z9189 Other specified personal risk factors, not elsewhere classified: Secondary | ICD-10-CM | POA: Diagnosis not present

## 2023-10-14 DIAGNOSIS — B20 Human immunodeficiency virus [HIV] disease: Secondary | ICD-10-CM | POA: Diagnosis not present

## 2023-10-14 DIAGNOSIS — Z79899 Other long term (current) drug therapy: Secondary | ICD-10-CM | POA: Diagnosis not present

## 2023-10-14 DIAGNOSIS — Z8619 Personal history of other infectious and parasitic diseases: Secondary | ICD-10-CM | POA: Diagnosis not present

## 2023-10-14 DIAGNOSIS — Z113 Encounter for screening for infections with a predominantly sexual mode of transmission: Secondary | ICD-10-CM | POA: Diagnosis not present

## 2023-10-14 DIAGNOSIS — R7989 Other specified abnormal findings of blood chemistry: Secondary | ICD-10-CM | POA: Diagnosis not present

## 2023-10-14 DIAGNOSIS — M791 Myalgia, unspecified site: Secondary | ICD-10-CM | POA: Diagnosis not present

## 2023-11-15 DIAGNOSIS — M85842 Other specified disorders of bone density and structure, left hand: Secondary | ICD-10-CM | POA: Diagnosis not present

## 2023-11-15 DIAGNOSIS — Z125 Encounter for screening for malignant neoplasm of prostate: Secondary | ICD-10-CM | POA: Diagnosis not present

## 2023-11-15 DIAGNOSIS — R399 Unspecified symptoms and signs involving the genitourinary system: Secondary | ICD-10-CM | POA: Diagnosis not present

## 2023-11-15 DIAGNOSIS — M85841 Other specified disorders of bone density and structure, right hand: Secondary | ICD-10-CM | POA: Diagnosis not present

## 2023-11-15 DIAGNOSIS — Z9189 Other specified personal risk factors, not elsewhere classified: Secondary | ICD-10-CM | POA: Diagnosis not present

## 2023-11-15 DIAGNOSIS — Z23 Encounter for immunization: Secondary | ICD-10-CM | POA: Diagnosis not present

## 2023-11-21 DIAGNOSIS — M8589 Other specified disorders of bone density and structure, multiple sites: Secondary | ICD-10-CM | POA: Diagnosis not present

## 2023-11-21 DIAGNOSIS — M85841 Other specified disorders of bone density and structure, right hand: Secondary | ICD-10-CM | POA: Diagnosis not present

## 2023-11-21 DIAGNOSIS — M85842 Other specified disorders of bone density and structure, left hand: Secondary | ICD-10-CM | POA: Diagnosis not present

## 2024-01-08 DIAGNOSIS — R58 Hemorrhage, not elsewhere classified: Secondary | ICD-10-CM | POA: Diagnosis not present

## 2024-01-08 DIAGNOSIS — W19XXXA Unspecified fall, initial encounter: Secondary | ICD-10-CM | POA: Diagnosis not present

## 2024-01-08 DIAGNOSIS — I10 Essential (primary) hypertension: Secondary | ICD-10-CM | POA: Diagnosis not present

## 2024-01-08 DIAGNOSIS — G4489 Other headache syndrome: Secondary | ICD-10-CM | POA: Diagnosis not present

## 2024-01-08 DIAGNOSIS — F1090 Alcohol use, unspecified, uncomplicated: Secondary | ICD-10-CM | POA: Diagnosis not present

## 2024-01-08 DIAGNOSIS — S0990XA Unspecified injury of head, initial encounter: Secondary | ICD-10-CM | POA: Diagnosis not present

## 2024-01-08 DIAGNOSIS — S0181XA Laceration without foreign body of other part of head, initial encounter: Secondary | ICD-10-CM | POA: Diagnosis not present

## 2024-01-08 DIAGNOSIS — L550 Sunburn of first degree: Secondary | ICD-10-CM | POA: Diagnosis not present

## 2024-01-08 DIAGNOSIS — F17210 Nicotine dependence, cigarettes, uncomplicated: Secondary | ICD-10-CM | POA: Diagnosis not present

## 2024-01-13 DIAGNOSIS — Z9189 Other specified personal risk factors, not elsewhere classified: Secondary | ICD-10-CM | POA: Diagnosis not present

## 2024-01-13 DIAGNOSIS — R0781 Pleurodynia: Secondary | ICD-10-CM | POA: Diagnosis not present

## 2024-01-13 DIAGNOSIS — Z79899 Other long term (current) drug therapy: Secondary | ICD-10-CM | POA: Diagnosis not present

## 2024-01-13 DIAGNOSIS — Z113 Encounter for screening for infections with a predominantly sexual mode of transmission: Secondary | ICD-10-CM | POA: Diagnosis not present

## 2024-01-13 DIAGNOSIS — M7989 Other specified soft tissue disorders: Secondary | ICD-10-CM | POA: Diagnosis not present

## 2024-01-13 DIAGNOSIS — Z21 Asymptomatic human immunodeficiency virus [HIV] infection status: Secondary | ICD-10-CM | POA: Diagnosis not present

## 2024-01-13 DIAGNOSIS — Z043 Encounter for examination and observation following other accident: Secondary | ICD-10-CM | POA: Diagnosis not present

## 2024-01-13 DIAGNOSIS — B20 Human immunodeficiency virus [HIV] disease: Secondary | ICD-10-CM | POA: Diagnosis not present

## 2024-01-13 DIAGNOSIS — Z5181 Encounter for therapeutic drug level monitoring: Secondary | ICD-10-CM | POA: Diagnosis not present

## 2024-01-19 DIAGNOSIS — I82512 Chronic embolism and thrombosis of left femoral vein: Secondary | ICD-10-CM | POA: Diagnosis not present

## 2024-01-19 DIAGNOSIS — M7989 Other specified soft tissue disorders: Secondary | ICD-10-CM | POA: Diagnosis not present

## 2024-05-09 DIAGNOSIS — Z8042 Family history of malignant neoplasm of prostate: Secondary | ICD-10-CM | POA: Diagnosis not present

## 2024-05-09 DIAGNOSIS — Z5181 Encounter for therapeutic drug level monitoring: Secondary | ICD-10-CM | POA: Diagnosis not present

## 2024-05-09 DIAGNOSIS — Z86718 Personal history of other venous thrombosis and embolism: Secondary | ICD-10-CM | POA: Diagnosis not present

## 2024-05-09 DIAGNOSIS — B20 Human immunodeficiency virus [HIV] disease: Secondary | ICD-10-CM | POA: Diagnosis not present

## 2024-05-09 DIAGNOSIS — Z113 Encounter for screening for infections with a predominantly sexual mode of transmission: Secondary | ICD-10-CM | POA: Diagnosis not present

## 2024-05-09 DIAGNOSIS — Z21 Asymptomatic human immunodeficiency virus [HIV] infection status: Secondary | ICD-10-CM | POA: Diagnosis not present

## 2024-05-09 DIAGNOSIS — Z23 Encounter for immunization: Secondary | ICD-10-CM | POA: Diagnosis not present

## 2024-05-09 DIAGNOSIS — Z79899 Other long term (current) drug therapy: Secondary | ICD-10-CM | POA: Diagnosis not present

## 2024-05-09 DIAGNOSIS — M8589 Other specified disorders of bone density and structure, multiple sites: Secondary | ICD-10-CM | POA: Diagnosis not present

## 2024-05-09 DIAGNOSIS — Z0184 Encounter for antibody response examination: Secondary | ICD-10-CM | POA: Diagnosis not present

## 2024-05-09 DIAGNOSIS — Z0000 Encounter for general adult medical examination without abnormal findings: Secondary | ICD-10-CM | POA: Diagnosis not present

## 2024-05-09 DIAGNOSIS — Z13220 Encounter for screening for lipoid disorders: Secondary | ICD-10-CM | POA: Diagnosis not present

## 2024-05-09 DIAGNOSIS — Z9189 Other specified personal risk factors, not elsewhere classified: Secondary | ICD-10-CM | POA: Diagnosis not present

## 2024-05-09 DIAGNOSIS — I10 Essential (primary) hypertension: Secondary | ICD-10-CM | POA: Diagnosis not present

## 2024-06-13 DIAGNOSIS — I10 Essential (primary) hypertension: Secondary | ICD-10-CM | POA: Diagnosis not present

## 2024-08-02 DIAGNOSIS — I10 Essential (primary) hypertension: Secondary | ICD-10-CM | POA: Diagnosis not present

## 2024-08-02 DIAGNOSIS — Z23 Encounter for immunization: Secondary | ICD-10-CM | POA: Diagnosis not present
